# Patient Record
Sex: Male | Born: 1990 | Race: Asian | Hispanic: No | Marital: Single | Smoking: Never smoker
Health system: Southern US, Community
[De-identification: ages and names within clinical notes are randomized; demographics above are authoritative.]

---

## 2018-08-10 DIAGNOSIS — R1013 Epigastric pain: Secondary | ICD-10-CM | POA: Diagnosis not present

## 2018-08-13 DIAGNOSIS — K625 Hemorrhage of anus and rectum: Secondary | ICD-10-CM | POA: Diagnosis not present

## 2018-08-14 ENCOUNTER — Emergency Department (HOSPITAL_COMMUNITY)
Admission: EM | Admit: 2018-08-14 | Discharge: 2018-08-14 | Disposition: A | Discharge status: Home / Self Care | Payer: 59 | Attending: Emergency Medicine | Admitting: Emergency Medicine

## 2018-08-14 ENCOUNTER — Emergency Department (HOSPITAL_COMMUNITY): Payer: 59

## 2018-08-14 ENCOUNTER — Encounter (HOSPITAL_COMMUNITY): Payer: Self-pay

## 2018-08-14 DIAGNOSIS — R0789 Other chest pain: Secondary | ICD-10-CM | POA: Diagnosis not present

## 2018-08-14 DIAGNOSIS — R142 Eructation: Secondary | ICD-10-CM

## 2018-08-14 DIAGNOSIS — R079 Chest pain, unspecified: Secondary | ICD-10-CM | POA: Diagnosis not present

## 2018-08-14 LAB — BASIC METABOLIC PANEL
Anion gap: 7 (ref 5–15)
BUN: 7 mg/dL (ref 6–20)
CALCIUM: 9.2 mg/dL (ref 8.9–10.3)
CO2: 25 mmol/L (ref 22–32)
CREATININE: 1.14 mg/dL (ref 0.61–1.24)
Chloride: 104 mmol/L (ref 98–111)
GFR calc Af Amer: >60 mL/min (ref >60)
GLUCOSE: 106 mg/dL — AB (ref 70–99)
Potassium: 3.7 mmol/L (ref 3.5–5.1)
Sodium: 136 mmol/L (ref 135–145)

## 2018-08-14 LAB — CBC
HCT: 47.3 % (ref 39.0–52.0)
Hemoglobin: 15.2 g/dL (ref 13.0–17.0)
MCH: 28.1 pg (ref 26.0–34.0)
MCHC: 32.1 g/dL (ref 30.0–36.0)
MCV: 87.6 fL (ref 80.0–100.0)
PLATELETS: 244 10*3/uL (ref 150–400)
RBC: 5.4 MIL/uL (ref 4.22–5.81)
RDW: 13 % (ref 11.5–15.5)
WBC: 5.3 10*3/uL (ref 4.0–10.5)
nRBC: 0 % (ref 0.0–0.2)

## 2018-08-14 LAB — I-STAT TROPONIN, ED: TROPONIN I, POC: 0 ng/mL (ref 0.00–0.08)

## 2018-08-14 MED ORDER — LIDOCAINE VISCOUS HCL 2 % MT SOLN
15.0000 mL | Freq: Once | OROMUCOSAL | Status: AC
  Administered 2018-08-14: 15 mL via ORAL
  Filled 2018-08-14: qty 15

## 2018-08-14 MED ORDER — OMEPRAZOLE 20 MG PO CPDR: 20.0000 mg | DELAYED_RELEASE_CAPSULE | Freq: Every day | ORAL | 0 refills | Status: DC

## 2018-08-14 MED ORDER — ALUM & MAG HYDROXIDE-SIMETH 200-200-20 MG/5ML PO SUSP
30.0000 mL | Freq: Once | ORAL | Status: AC
  Administered 2018-08-14: 30 mL via ORAL
  Filled 2018-08-14: qty 30

## 2018-08-14 NOTE — ED Triage Notes (Signed)
Pt presents for evaluation of L sided chest pain starting today. Pt reports he has had some GI distress with belching/gas since Thursday, worse after eating. Pt reports radiation of chest pain to L arm and back.

## 2018-08-14 NOTE — ED Provider Notes (Signed)
[facility] EMERGENCY DEPARTMENT Provider Note   CSN: [id number] Arrival date & time: 08/14/18  1453     History   Chief Complaint Chief Complaint  Patient presents with  . Chest Pain    HPI Christopher Little is a 27 y.o. male with no significant past medical history presents emergency department today for chest pressure.  Patient reports that on Thursday he ate spicy food and shortly after began having substernal burning, "gas" as well as burping and belching.  He notes that since then time he ate spicy food been experiencing the same symptoms.  He has been trying home remedies including hot water with ginger, honey and a natural herb for his symptoms without any relief.  He notes that today he also felt a pulling sensation in his left wrist for a few seconds it was not associated with the burning in his chest.  He was reading things online and did not want to arrest so he came in.  His symptoms are not exertional, pleuritic or positional nature.  He denies any abdominal pain.  There is no associated nausea, vomiting, diaphoresis.  He denies fever cough.  Patient does report family history of CAD including patient's father.  Patient denies any tobacco use or illicit drug use. Denies risk factors for PE including exogenous testosterone use, recent surgery or travel, trauma, immobilization, previous blood clot, hemoptysis, cancer, lower extremity pain or swelling.   HPI  History reviewed. No pertinent past medical history.  There are no active problems to display for this patient.   History reviewed. No pertinent surgical history.      Home Medications    Prior to Admission medications   Not on File    Family History No family history on file.  Social History Social History   Tobacco Use  . Smoking status: Not on file  Substance Use Topics  . Alcohol use: Not on file  . Drug use: Not on file     Allergies   Patient has no known allergies.   Review of  Systems Review of Systems  All other systems reviewed and are negative.    Physical Exam Updated Vital Signs BP 116/80   Pulse 68   Temp 97.9 F (36.6 C) (Oral)   Resp 16   SpO2 100%   Physical Exam  Constitutional: He appears well-developed and well-nourished.  HENT:  Head: Normocephalic and atraumatic.  Right Ear: External ear normal.  Left Ear: External ear normal.  Nose: Nose normal.  Mouth/Throat: Uvula is midline, oropharynx is clear and moist and mucous membranes are normal. No tonsillar exudate.  Eyes: Pupils are equal, round, and reactive to light. Right eye exhibits no discharge. Left eye exhibits no discharge. No scleral icterus.  Neck: Trachea normal. Neck supple. No spinous process tenderness present. No neck rigidity. Normal range of motion present.  Cardiovascular: Normal rate, regular rhythm and intact distal pulses.  No murmur heard. Pulses:      Radial pulses are 2+ on the right side, and 2+ on the left side.       Dorsalis pedis pulses are 2+ on the right side, and 2+ on the left side.       Posterior tibial pulses are 2+ on the right side, and 2+ on the left side.  No lower extremity swelling or edema. Calves symmetric in size bilaterally.  Pulmonary/Chest: Effort normal and breath sounds normal. He exhibits no tenderness.  Abdominal: Soft. Bowel sounds are normal. He exhibits  no distension. There is no tenderness. There is no rigidity, no rebound, no guarding, no CVA tenderness and negative Murphy's sign.  Musculoskeletal: He exhibits no edema.  Lymphadenopathy:    He has no cervical adenopathy.  Neurological: He is alert.  Skin: Skin is warm and dry. No rash noted. He is not diaphoretic.  Psychiatric: He has a normal mood and affect.  Nursing note and vitals reviewed.    ED Treatments / Results  Labs (all labs ordered are listed, but only abnormal results are displayed) Labs Reviewed  BASIC METABOLIC PANEL - Abnormal; Notable for the following  components:      Result Value   Glucose, Bld 106 (*)    All other components within normal limits  CBC  I-STAT TROPONIN, ED    EKG None  Radiology Dg Chest 2 View  Result Date: 08/14/2018 CLINICAL DATA:  27 y/o  M; left-sided chest pain. EXAM: CHEST - 2 VIEW COMPARISON:  None. FINDINGS: The heart size and mediastinal contours are within normal limits. Both lungs are clear. The visualized skeletal structures are unremarkable. IMPRESSION: No acute pulmonary process identified. Electronically Signed   By: Mitzi Hansen M.D.   On: 08/14/2018 15:38    Procedures Procedures (including critical care time)  Medications Ordered in ED Medications  alum & mag hydroxide-simeth (MAALOX/MYLANTA) 200-200-20 MG/5ML suspension 30 mL (30 mLs Oral Given 08/14/18 1836)    And  lidocaine (XYLOCAINE) 2 % viscous mouth solution 15 mL (15 mLs Oral Given 08/14/18 1836)     Initial Impression / Assessment and Plan / ED Course  I have reviewed the triage vital signs and the nursing notes.  Pertinent labs & imaging results that were available during my care of the patient were reviewed by me and considered in my medical decision making (see chart for details).     Patient presented with burning in the chest with burping and belching after eating spicy foods to the ED. Patient is to be discharged with recommendation to follow up with PCP in regards to today's hospital visit. Chest pain is not likely of cardiac or pulmonary etiology due to presentation, perc negative, stable vital signs, no tracheal deviation, no JVD or new murmur, RRR, breath sounds equal bilaterally, EKG without acute abnormalities, negative troponin, and negative CXR. HEART score is low risk. Patient has been advised to return to the ED if chest pain becomes exertional, associated with diaphoresis or nausea, radiates to left jaw/arm, worsens or becomes concerning in any way. Patient appears reliable for follow up and is agreeable  to discharge. Pt had improvement after gi cocktail. Will give PPI. Do not suspect cholecystitis as patient is without any right upper quadrant tenderness palpation is a negative Murphy sign.  I advised the patient to follow-up with their primary care provider this week. I advised the patient to return to the emergency department with new or worsening symptoms or new concerns. The patient verbalized understanding and agreement with plan. Patient stable for discharge.   Final Clinical Impressions(s) / ED Diagnoses   Final diagnoses:  Burning in the chest  Belching    ED Discharge Orders         Ordered    omeprazole (PRILOSEC) 20 MG capsule  Daily     08/14/18 1917           Princella Pellegrini 08/14/18 1917    Melene Plan, DO 08/15/18 1107

## 2018-08-14 NOTE — Discharge Instructions (Signed)
Read instructions below for reasons to return to the Emergency Department. It is recommended that your follow up with your Primary Care Doctor in regards to today's visit. If you do not have a doctor, use the resource guide listed below to help you find one.  Begin taking over the counter Prilosec or Zegrid (omeprazole) as directed.   Tests performed today include: An EKG of your heart A chest x-ray Cardiac enzymes - a blood test for heart muscle damage Blood counts and electrolytes Vital signs. See below for your results today.  Chest Pain (Nonspecific)  HOME CARE INSTRUCTIONS  For the next few days, avoid physical activities that bring on chest pain. Continue physical activities as directed.  Do not smoke cigarettes or drink alcohol until your symptoms are gone. If you do smoke, it is time to quit. You may receive instructions and counseling on how to stop smoking. Only take over-the-counter or prescription medicine for pain, discomfort, or fever as directed by your caregiver.  Follow your caregiver's suggestions for further testing if your chest pain does not go away.  Keep any follow-up appointments you made. If you do not go to an appointment, you could develop lasting (chronic) problems with pain. If there is any problem keeping an appointment, you must call to reschedule.  SEEK MEDICAL CARE IF:  You think you are having problems from the medicine you are taking. Read your medicine instructions carefully.  Your chest pain does not go away, even after treatment.  You develop a rash with blisters on your chest.  SEEK IMMEDIATE MEDICAL CARE IF:  You have increased chest pain or pain that spreads to your arm, neck, jaw, back, or belly (abdomen).  You develop shortness of breath, an increasing cough, or you are coughing up blood.  You have severe back or abdominal pain, feel sick to your stomach (nauseous) or throw up (vomit).  You develop severe weakness, fainting, or chills.  You have an  oral temperature above 102 F (38.9 C), not controlled by medicine.  You have pain in your abdomen.  You vomit THIS IS AN EMERGENCY. Do not wait to see if the pain will go away. Get medical help at once. Call your local emergency services (911 in [place] Do not drive yourself to the hospital. Additional Information:  Your vital signs today were: BP 116/80    Pulse 68    Temp 97.9 F (36.6 C) (Oral)    Resp 16    SpO2 100%  If your blood pressure (BP) was elevated above 135/85 this visit, please have this repeated by your doctor within one month. ---------------

## 2018-08-28 ENCOUNTER — Other Ambulatory Visit: Payer: Self-pay

## 2018-08-28 ENCOUNTER — Encounter: Payer: Self-pay | Admitting: Emergency Medicine

## 2018-08-28 ENCOUNTER — Ambulatory Visit: Payer: 59 | Admitting: Emergency Medicine

## 2018-08-28 ENCOUNTER — Encounter

## 2018-08-28 VITALS — BP 126/78 | HR 81 | Temp 98.2°F | Resp 18 | Ht 71.65 in | Wt 229.4 lb

## 2018-08-28 DIAGNOSIS — R12 Heartburn: Secondary | ICD-10-CM | POA: Diagnosis not present

## 2018-08-28 DIAGNOSIS — K219 Gastro-esophageal reflux disease without esophagitis: Secondary | ICD-10-CM

## 2018-08-28 MED ORDER — RANITIDINE HCL 300 MG PO TABS: 300.0000 mg | ORAL_TABLET | Freq: Every day | ORAL | 1 refills | Status: DC

## 2018-08-28 NOTE — Patient Instructions (Addendum)
If you have lab work done today you will be contacted with your lab results within the next 2 weeks.  If you have not heard from us then please contact us. The fastest way to get your results is to register for My Chart.   IF you received an x-ray today, you will receive an invoice from [facility]. Please contact [facility] at [phone] with questions or concerns regarding your invoice.   IF you received labwork today, you will receive an invoice from [facility]. Please contact [facility] at [phone] with questions or concerns regarding your invoice.   Our billing staff will not be able to assist you with questions regarding bills from these companies.  You will be contacted with the lab results as soon as they are available. The fastest way to get your results is to activate your My Chart account. Instructions are located on the last page of this paperwork. If you have not heard from us regarding the results in 2 weeks, please contact this office.     Gastritis, Adult Gastritis is swelling (inflammation) of the stomach. When you have this condition, you can have these problems (symptoms):  Pain in your stomach.  A burning feeling in your stomach.  Feeling sick to your stomach (nauseous).  Throwing up (vomiting).  Feeling too full after you eat.  It is important to get help for this condition. Without help, your stomach can bleed, and you can get sores (ulcers) in your stomach. Follow these instructions at home:  Take over-the-counter and prescription medicines only as told by your doctor.  If you were prescribed an antibiotic medicine, take it as told by your doctor. Do not stop taking it even if you start to feel better.  Drink enough fluid to keep your pee (urine) clear or pale yellow.  Instead of eating big meals, eat small meals often. Contact a health care provider if:  Your problems get worse.  Your problems go away and then come  back. Get help right away if:  You throw up blood or something that looks like coffee grounds.  You have black or dark red poop (stools).  You cannot keep fluids down.  Your stomach pain gets worse.  You have a fever.  You do not feel better after 1 week. This information is not intended to replace advice given to you by your health care provider. Make sure you discuss any questions you have with your health care provider. Document Released: 02/22/2008 Document Revised: 05/04/2016 Document Reviewed: 05/30/2015 Elsevier Interactive Patient Education  2018 [employer]  Food Choices for Gastroesophageal Reflux Disease, Adult When you have gastroesophageal reflux disease (GERD), the foods you eat and your eating habits are very important. Choosing the right foods can help ease your discomfort. What guidelines do I need to follow?  Choose fruits, vegetables, whole grains, and low-fat dairy products.  Choose low-fat meat, fish, and poultry.  Limit fats such as oils, salad dressings, butter, nuts, and avocado.  Keep a food diary. This helps you identify foods that cause symptoms.  Avoid foods that cause symptoms. These may be different for everyone.  Eat small meals often instead of 3 large meals a day.  Eat your meals slowly, in a place where you are relaxed.  Limit fried foods.  Cook foods using methods other than frying.  Avoid drinking alcohol.  Avoid drinking large amounts of liquids with your meals.  Avoid bending over or lying down until 2-3 hours after  eating. What foods are not recommended? These are some foods and drinks that may make your symptoms worse: Vegetables Tomatoes. Tomato juice. Tomato and spaghetti sauce. Chili peppers. Onion and garlic. Horseradish. Fruits Oranges, grapefruit, and lemon (fruit and juice). Meats High-fat meats, fish, and poultry. This includes hot dogs, ribs, ham, sausage, salami, and bacon. Dairy Whole milk and chocolate milk.  Sour cream. Cream. Butter. Ice cream. Cream cheese. Drinks Coffee and tea. Bubbly (carbonated) drinks or energy drinks. Condiments Hot sauce. Barbecue sauce. Sweets/Desserts Chocolate and cocoa. Donuts. Peppermint and spearmint. Fats and Oils High-fat foods. This includes [place] fries and potato chips. Other Vinegar. Strong spices. This includes black pepper, white pepper, red pepper, cayenne, curry powder, cloves, ginger, and chili powder. The items listed above may not be a complete list of foods and drinks to avoid. Contact your dietitian for more information. This information is not intended to replace advice given to you by your health care provider. Make sure you discuss any questions you have with your health care provider. Document Released: 03/06/2012 Document Revised: 02/11/2016 Document Reviewed: 07/10/2013 Elsevier Interactive Patient Education  2017 [employer].

## 2018-08-28 NOTE — Progress Notes (Signed)
Christopher Little 27 y.o.   Chief Complaint  Patient presents with  . Gastroesophageal Reflux    15 years went to ER for this a couple weeks ago     HISTORY OF PRESENT ILLNESS: This is a 27 y.o. male here today for evaluation of heartburn.  Went to the emergency room on 08/14/2018 complaining of burning chest.  Work-up was negative.  Normal EKG, normal chest x-ray, and normal blood work including negative troponin.  Describes pain as intermittent, burning, with no radiation, increased belching, slight nausea, no vomiting, difficulty breathing, wheezing, melena or rectal bleeding.  Was started on omeprazole 20 mg daily.  Asymptomatic now.  Non-smoker and non-EtOH user.  Works as an Art gallery managerengineer.  Originally from [place].  No history of any chronic medical problems.  HPI   Prior to Admission medications   Medication Sig Start Date End Date Taking? Authorizing Provider  omeprazole (PRILOSEC) 20 MG capsule Take 1 capsule (20 mg total) by mouth daily. 08/14/18  Yes Maczis, Elmer SowMichael M, PA-C    No Known Allergies  There are no active problems to display for this patient.   No past medical history on file.  No past surgical history on file.  Social History   Socioeconomic History  . Marital status: Single    Spouse name: Not on file  . Number of children: Not on file  . Years of education: Not on file  . Highest education level: Not on file  Occupational History  . Not on file  Social Needs  . Financial resource strain: Not on file  . Food insecurity:    Worry: Not on file    Inability: Not on file  . Transportation needs:    Medical: Not on file    Non-medical: Not on file  Tobacco Use  . Smoking status: Never Smoker  . Smokeless tobacco: Never Used  Substance and Sexual Activity  . Alcohol use: Not on file  . Drug use: Not on file  . Sexual activity: Not on file  Lifestyle  . Physical activity:    Days per week: Not on file    Minutes per session: Not on file  . Stress: Not on  file  Relationships  . Social connections:    Talks on phone: Not on file    Gets together: Not on file    Attends religious service: Not on file    Active member of club or organization: Not on file    Attends meetings of clubs or organizations: Not on file    Relationship status: Not on file  . Intimate partner violence:    Fear of current or ex partner: Not on file    Emotionally abused: Not on file    Physically abused: Not on file    Forced sexual activity: Not on file  Other Topics Concern  . Not on file  Social History Narrative  . Not on file    No family history on file.   Review of Systems  Constitutional: Negative.  Negative for chills and fever.  HENT: Negative.  Negative for sore throat.   Eyes: Negative.  Negative for blurred vision and double vision.  Respiratory: Negative.  Negative for cough and shortness of breath.   Cardiovascular: Negative.  Negative for chest pain and palpitations.  Gastrointestinal: Positive for heartburn and nausea. Negative for abdominal pain, blood in stool, constipation, diarrhea, melena and vomiting.  Genitourinary: Negative.  Negative for dysuria and hematuria.  Musculoskeletal: Negative.  Negative for  back pain, myalgias and neck pain.  Skin: Negative.  Negative for rash.  Neurological: Negative.  Negative for dizziness and headaches.  Endo/Heme/Allergies: Negative.   All other systems reviewed and are negative.  Vitals:   08/28/18 1423  BP: 126/78  Pulse: 81  Resp: 18  Temp: 98.2 F (36.8 C)  SpO2: 99%     Physical Exam  Constitutional: He is oriented to person, place, and time. He appears well-developed and well-nourished.  HENT:  Head: Normocephalic and atraumatic.  Nose: Nose normal.  Mouth/Throat: Oropharynx is clear and moist.  Eyes: Pupils are equal, round, and reactive to light. Conjunctivae and EOM are normal.  Neck: Normal range of motion. Neck supple. No thyromegaly present.  Cardiovascular: Normal rate,  regular rhythm and normal heart sounds.  Pulmonary/Chest: Effort normal and breath sounds normal. No respiratory distress.  Abdominal: Soft. Bowel sounds are normal. He exhibits no distension and no mass. There is no tenderness. There is no guarding.  Musculoskeletal: Normal range of motion.  Lymphadenopathy:    He has no cervical adenopathy.  Neurological: He is alert and oriented to person, place, and time. No sensory deficit. He exhibits normal muscle tone.  Skin: Skin is warm and dry. Capillary refill takes less than 2 seconds.  Psychiatric: He has a normal mood and affect. His behavior is normal.  Vitals reviewed.  A total of 30 minutes was spent in the room with the patient, greater than 50% of which was in counseling/coordination of care regarding differential diagnosis, treatment, nutrition, medications, review of labs, EKG, and chest x-ray, prognosis, and need for follow-up if no better or worse.   ASSESSMENT & PLAN: Christopher Little was seen today for gastroesophageal reflux.  Diagnoses and all orders for this visit:  Gastroesophageal reflux disease without esophagitis -     ranitidine (ZANTAC) 300 MG tablet; Take 1 tablet (300 mg total) by mouth at bedtime for 14 days. -     CBC with Differential/Platelet -     Comprehensive metabolic panel -     H. pylori breath test -     Hemoglobin A1c -     Lipid panel  Heartburn -     H. pylori breath test    Patient Instructions       If you have lab work done today you will be contacted with your lab results within the next 2 weeks.  If you have not heard from [place] then please contact us. The fastest way to get your results is to register for My Chart.   IF you received an x-ray today, you will receive an invoice from [facility]. Please contact [facility] at [phone] with questions or concerns regarding your invoice.   IF you received labwork today, you will receive an invoice from [facility]. Please contact [facility]  at [phone] with questions or concerns regarding your invoice.   Our billing staff will not be able to assist you with questions regarding bills from these companies.  You will be contacted with the lab results as soon as they are available. The fastest way to get your results is to activate your My Chart account. Instructions are located on the last page of this paperwork. If you have not heard from [place] regarding the results in 2 weeks, please contact this office.     Gastritis, Adult Gastritis is swelling (inflammation) of the stomach. When you have this condition, you can have these problems (symptoms):  Pain in your stomach.  A burning feeling in  your stomach.  Feeling sick to your stomach (nauseous).  Throwing up (vomiting).  Feeling too full after you eat.  It is important to get help for this condition. Without help, your stomach can bleed, and you can get sores (ulcers) in your stomach. Follow these instructions at home:  Take over-the-counter and prescription medicines only as told by your doctor.  If you were prescribed an antibiotic medicine, take it as told by your doctor. Do not stop taking it even if you start to feel better.  Drink enough fluid to keep your pee (urine) clear or pale yellow.  Instead of eating big meals, eat small meals often. Contact a health care provider if:  Your problems get worse.  Your problems go away and then come back. Get help right away if:  You throw up blood or something that looks like coffee grounds.  You have black or dark red poop (stools).  You cannot keep fluids down.  Your stomach pain gets worse.  You have a fever.  You do not feel better after 1 week. This information is not intended to replace advice given to you by your health care provider. Make sure you discuss any questions you have with your health care provider. Document Released: 02/22/2008 Document Revised: 05/04/2016 Document Reviewed:  05/30/2015 Elsevier Interactive Patient Education  2018 [employer].  Food Choices for Gastroesophageal Reflux Disease, Adult When you have gastroesophageal reflux disease (GERD), the foods you eat and your eating habits are very important. Choosing the right foods can help ease your discomfort. What guidelines do I need to follow?  Choose fruits, vegetables, whole grains, and low-fat dairy products.  Choose low-fat meat, fish, and poultry.  Limit fats such as oils, salad dressings, butter, nuts, and avocado.  Keep a food diary. This helps you identify foods that cause symptoms.  Avoid foods that cause symptoms. These may be different for everyone.  Eat small meals often instead of 3 large meals a day.  Eat your meals slowly, in a place where you are relaxed.  Limit fried foods.  Cook foods using methods other than frying.  Avoid drinking alcohol.  Avoid drinking large amounts of liquids with your meals.  Avoid bending over or lying down until 2-3 hours after eating. What foods are not recommended? These are some foods and drinks that may make your symptoms worse: Vegetables Tomatoes. Tomato juice. Tomato and spaghetti sauce. Chili peppers. Onion and garlic. Horseradish. Fruits Oranges, grapefruit, and lemon (fruit and juice). Meats High-fat meats, fish, and poultry. This includes hot dogs, ribs, ham, sausage, salami, and bacon. Dairy Whole milk and chocolate milk. Sour cream. Cream. Butter. Ice cream. Cream cheese. Drinks Coffee and tea. Bubbly (carbonated) drinks or energy drinks. Condiments Hot sauce. Barbecue sauce. Sweets/Desserts Chocolate and cocoa. Donuts. Peppermint and spearmint. Fats and Oils High-fat foods. This includes [place] fries and potato chips. Other Vinegar. Strong spices. This includes black pepper, white pepper, red pepper, cayenne, curry powder, cloves, ginger, and chili powder. The items listed above may not be a complete list of foods and  drinks to avoid. Contact your dietitian for more information. This information is not intended to replace advice given to you by your health care provider. Make sure you discuss any questions you have with your health care provider. Document Released: 03/06/2012 Document Revised: 02/11/2016 Document Reviewed: 07/10/2013 Elsevier Interactive Patient Education  2017 Elsevier Inc.      Edwina Barth, MD [facility]

## 2018-08-29 LAB — COMPREHENSIVE METABOLIC PANEL
ALK PHOS: 72 IU/L (ref 39–117)
ALT: 17 IU/L (ref 0–44)
AST: 18 IU/L (ref 0–40)
Albumin/Globulin Ratio: 1.8 (ref 1.2–2.2)
Albumin: 4.6 g/dL (ref 3.5–5.5)
BUN / CREAT RATIO: 8 — AB (ref 9–20)
BUN: 9 mg/dL (ref 6–20)
Bilirubin Total: 0.4 mg/dL (ref 0.0–1.2)
CALCIUM: 9.6 mg/dL (ref 8.7–10.2)
CHLORIDE: 101 mmol/L (ref 96–106)
CO2: 23 mmol/L (ref 20–29)
Creatinine, Ser: 1.11 mg/dL (ref 0.76–1.27)
GFR calc Af Amer: 105 mL/min/{1.73_m2} (ref >59)
GFR, EST NON AFRICAN AMERICAN: 91 mL/min/{1.73_m2} (ref >59)
GLOBULIN, TOTAL: 2.5 g/dL (ref 1.5–4.5)
GLUCOSE: 85 mg/dL (ref 65–99)
Potassium: 4.1 mmol/L (ref 3.5–5.2)
Sodium: 138 mmol/L (ref 134–144)
TOTAL PROTEIN: 7.1 g/dL (ref 6.0–8.5)

## 2018-08-29 LAB — CBC WITH DIFFERENTIAL/PLATELET
BASOS ABS: 0 10*3/uL (ref 0.0–0.2)
BASOS: 1 %
EOS (ABSOLUTE): 0 10*3/uL (ref 0.0–0.4)
Eos: 0 %
Hematocrit: 42.5 % (ref 37.5–51.0)
Hemoglobin: 14.4 g/dL (ref 13.0–17.7)
Immature Grans (Abs): 0 10*3/uL (ref 0.0–0.1)
Immature Granulocytes: 0 %
LYMPHS: 35 %
Lymphocytes Absolute: 2.2 10*3/uL (ref 0.7–3.1)
MCH: 28.3 pg (ref 26.6–33.0)
MCHC: 33.9 g/dL (ref 31.5–35.7)
MCV: 84 fL (ref 79–97)
Monocytes Absolute: 0.6 10*3/uL (ref 0.1–0.9)
Monocytes: 10 %
Neutrophils Absolute: 3.4 10*3/uL (ref 1.4–7.0)
Neutrophils: 54 %
Platelets: 273 10*3/uL (ref 150–450)
RBC: 5.08 x10E6/uL (ref 4.14–5.80)
RDW: 13.6 % (ref 12.3–15.4)
WBC: 6.3 10*3/uL (ref 3.4–10.8)

## 2018-08-29 LAB — HEMOGLOBIN A1C
Est. average glucose Bld gHb Est-mCnc: 108 mg/dL
Hgb A1c MFr Bld: 5.4 % (ref 4.8–5.6)

## 2018-08-29 LAB — LIPID PANEL
Chol/HDL Ratio: 5.1 ratio — ABNORMAL HIGH (ref 0.0–5.0)
Cholesterol, Total: 147 mg/dL (ref 100–199)
HDL: 29 mg/dL — ABNORMAL LOW (ref >39)
LDL Calculated: 82 mg/dL (ref 0–99)
TRIGLYCERIDES: 181 mg/dL — AB (ref 0–149)
VLDL CHOLESTEROL CAL: 36 mg/dL (ref 5–40)

## 2018-08-29 LAB — H. PYLORI BREATH TEST: H PYLORI BREATH TEST: POSITIVE — AB

## 2018-08-29 LAB — H. PYLORI BREATH COLLECTION

## 2018-08-31 ENCOUNTER — Other Ambulatory Visit: Payer: Self-pay | Admitting: Emergency Medicine

## 2018-08-31 ENCOUNTER — Encounter: Payer: Self-pay | Admitting: Emergency Medicine

## 2018-08-31 DIAGNOSIS — A048 Other specified bacterial intestinal infections: Secondary | ICD-10-CM

## 2018-08-31 MED ORDER — AMOXICILL-CLARITHRO-LANSOPRAZ PO MISC: Freq: Two times a day (BID) | ORAL | 1 refills | Status: DC

## 2018-09-04 ENCOUNTER — Other Ambulatory Visit: Payer: Self-pay | Admitting: Emergency Medicine

## 2018-09-04 ENCOUNTER — Ambulatory Visit: Payer: Self-pay

## 2018-09-04 ENCOUNTER — Encounter: Payer: Self-pay | Admitting: Emergency Medicine

## 2018-09-04 DIAGNOSIS — A048 Other specified bacterial intestinal infections: Secondary | ICD-10-CM

## 2018-09-04 DIAGNOSIS — K279 Peptic ulcer, site unspecified, unspecified as acute or chronic, without hemorrhage or perforation: Secondary | ICD-10-CM

## 2018-09-04 MED ORDER — LANSOPRAZOLE 30 MG PO TBDD: 30.0000 mg | DELAYED_RELEASE_TABLET | Freq: Two times a day (BID) | ORAL | 0 refills | Status: DC

## 2018-09-04 MED ORDER — AMOXICILLIN 500 MG PO CAPS: 1000.0000 mg | ORAL_CAPSULE | Freq: Two times a day (BID) | ORAL | 0 refills | Status: AC

## 2018-09-04 MED ORDER — CLARITHROMYCIN 500 MG PO TABS: 500.0000 mg | ORAL_TABLET | Freq: Two times a day (BID) | ORAL | 0 refills | Status: AC

## 2018-09-04 NOTE — Telephone Encounter (Signed)
Copied from CRM [id number]. Topic: General - Other >> Sep 04, 2018  9:45 AM Percival SpanishKennedy, Cheryl W wrote:  Pt call to say the medicine is not available per the pharmacy  amoxicillin-clarithromycin-lansoprazole [facility](PREVPAC) combo pack and is  not available and is requesting something else   Pt said is having some pain and is needing something today  please call pt and let him know what is going on      Pharmacy    [facility]

## 2018-09-04 NOTE — Telephone Encounter (Signed)
Pt called stating that he has burning pain to his abdomin. He has been seen last week by Dr Alvy BimlerSagardia and ordered an antibiotic that is not available at his pharmacy or any others that he has tried. He state it is over $400. Pt states that he has noticed his stools are darkening. He states his stomach is tender to touch. Pt states he go from constipation to normal BM but continues to have the pain. Pt is requesting that Dr Alvy BimlerSagardia order the nmedications individually. His pharmacy just needs the dose of each. Per protocol pt will go to urgent care for re evaluation of his symptoms within the next 24 hours. Care advice was to go to urgent care. Message will be routed to PCP for medication request.  Reason for Disposition . [1] MODERATE pain (e.g., interferes with normal activities) AND [2] pain comes and goes (cramps) AND [3] present > 24 hours  (Exception: pain with Vomiting or Diarrhea - see that Guideline)  Answer Assessment - Initial Assessment Questions 1. LOCATION: "Where does it hurt?"      All over abdomin  2. RADIATION: "Does the pain shoot anywhere else?" (e.g., chest, back)    continous pain and goes to back 3. ONSET: "When did the pain begin?" (Minutes, hours or days ago)      1 week ago 4. SUDDEN: "Gradual or sudden onset?"     gradual 5. PATTERN "Does the pain come and go, or is it constant?"    - If constant: "Is it getting better, staying the same, or worsening?"      (Note: Constant means the pain never goes away completely; most serious pain is constant and it progresses)     - If intermittent: "How long does it last?" "Do you have pain now?"     (Note: Intermittent means the pain goes away completely between bouts)     constant 6. SEVERITY: "How bad is the pain?"  (e.g., Scale 1-10; mild, moderate, or severe)    - MILD (1-3): doesn't interfere with normal activities, abdomen soft and not tender to touch     - MODERATE (4-7): interferes with normal activities or awakens from  sleep, tender to touch     - SEVERE (8-10): excruciating pain, doubled over, unable to do any normal activities       Tender interfers with normal activity 7. RECURRENT SYMPTOM: "Have you ever had this type of abdominal pain before?" If so, ask: "When was the last time?" and "What happened that time?"      no 8. CAUSE: "What do you think is causing the abdominal pain?"     unsure 9. RELIEVING/AGGRAVATING FACTORS: "What makes it better or worse?" (e.g., movement, antacids, bowel movement)     Dark constipation to normal yogert makes it better 10. OTHER SYMPTOMS: "Has there been any vomiting, diarrhea, constipation, or urine problems?"       Constipation to normal  Protocols used: ABDOMINAL PAIN - MALE-A-AH

## 2018-09-06 ENCOUNTER — Other Ambulatory Visit: Payer: Self-pay | Admitting: Emergency Medicine

## 2018-09-06 MED ORDER — LANSOPRAZOLE 30 MG PO CPDR: 30.0000 mg | DELAYED_RELEASE_CAPSULE | Freq: Every day | ORAL | 0 refills | Status: DC

## 2018-09-06 NOTE — Telephone Encounter (Signed)
Request for PA was sent in and came back DENIED for LANSOPRAZOLE TAB 30 MG Delayed release orally disintegrating tablet.   One of the following must be met which were not on the original PA:  1. You are unable to take a solid dosage form due to one of the following: (a) Oral/motor difficulties or (b) difficulty swallowing. 2. OR you use a feeding tube for medication administration  Please advise. Thank you!  Forms are in Dr. Barry Brunner box at the nurse's station

## 2018-09-06 NOTE — Telephone Encounter (Signed)
Prescription for regular Prevacid sent.  No need for PA.  Thanks.

## 2018-09-07 NOTE — Telephone Encounter (Signed)
FYI

## 2018-09-07 NOTE — Telephone Encounter (Signed)
This was already taking care of 2 days ago.

## 2018-09-13 ENCOUNTER — Encounter: Payer: Self-pay | Admitting: Emergency Medicine

## 2018-09-14 NOTE — Telephone Encounter (Signed)
Needs OV.  

## 2018-09-26 ENCOUNTER — Encounter

## 2018-09-26 ENCOUNTER — Ambulatory Visit: Payer: 59 | Admitting: Emergency Medicine

## 2018-09-26 ENCOUNTER — Encounter: Payer: Self-pay | Admitting: Emergency Medicine

## 2018-09-26 VITALS — BP 113/78 | HR 67 | Temp 98.8°F | Resp 17 | Ht 71.0 in | Wt 223.0 lb

## 2018-09-26 DIAGNOSIS — A048 Other specified bacterial intestinal infections: Secondary | ICD-10-CM

## 2018-09-26 DIAGNOSIS — K279 Peptic ulcer, site unspecified, unspecified as acute or chronic, without hemorrhage or perforation: Secondary | ICD-10-CM

## 2018-09-26 DIAGNOSIS — K219 Gastro-esophageal reflux disease without esophagitis: Secondary | ICD-10-CM | POA: Diagnosis not present

## 2018-09-26 MED ORDER — OMEPRAZOLE 40 MG PO CPDR: 40.0000 mg | DELAYED_RELEASE_CAPSULE | Freq: Every day | ORAL | 3 refills | Status: AC

## 2018-09-26 NOTE — Progress Notes (Signed)
Christopher Little 28 y.o.   Chief Complaint  Patient presents with  . Abdominal Pain    follow up    HISTORY OF PRESENT ILLNESS: This is a 28 y.o. male seen by me on 08/28/2018 with epigastric pain.  Work-up showed positive H. pylori.  Treated with antibiotics and PPI.  Last dose on 09/16/2018.  Still has some residual epigastric intermittent discomfort.  Able to eat and drink.  Denies nausea or vomiting.  No other significant symptoms.  HPI   Prior to Admission medications   Medication Sig Start Date End Date Taking? Authorizing Provider  amoxicillin-clarithromycin-lansoprazole [facility]) combo pack Take by mouth 2 (two) times daily. Follow package directions. Patient not taking: Reported on 09/26/2018 08/31/18   Georgina Quint, MD  omeprazole (PRILOSEC) 20 MG capsule Take 1 capsule (20 mg total) by mouth daily. Patient not taking: Reported on 09/26/2018 08/14/18   Jacinto Halim, PA-C    No Known Allergies  Patient Active Problem List   Diagnosis Date Noted  . Gastroesophageal reflux disease without esophagitis 08/28/2018  . Heartburn 08/28/2018    No past medical history on file.  No past surgical history on file.  Social History   Socioeconomic History  . Marital status: Single    Spouse name: Not on file  . Number of children: Not on file  . Years of education: Not on file  . Highest education level: Not on file  Occupational History  . Not on file  Social Needs  . Financial resource strain: Not on file  . Food insecurity:    Worry: Not on file    Inability: Not on file  . Transportation needs:    Medical: Not on file    Non-medical: Not on file  Tobacco Use  . Smoking status: Never Smoker  . Smokeless tobacco: Never Used  Substance and Sexual Activity  . Alcohol use: Not on file  . Drug use: Not on file  . Sexual activity: Not on file  Lifestyle  . Physical activity:    Days per week: Not on file    Minutes per session: Not on file  . Stress: Not  on file  Relationships  . Social connections:    Talks on phone: Not on file    Gets together: Not on file    Attends religious service: Not on file    Active member of club or organization: Not on file    Attends meetings of clubs or organizations: Not on file    Relationship status: Not on file  . Intimate partner violence:    Fear of current or ex partner: Not on file    Emotionally abused: Not on file    Physically abused: Not on file    Forced sexual activity: Not on file  Other Topics Concern  . Not on file  Social History Narrative  . Not on file    No family history on file.   Review of Systems  Constitutional: Negative.  Negative for chills, fever and weight loss.  HENT: Negative.  Negative for congestion and sore throat.   Eyes: Negative.  Negative for blurred vision and double vision.  Respiratory: Negative.  Negative for cough and shortness of breath.   Cardiovascular: Negative.  Negative for chest pain and palpitations.  Gastrointestinal: Positive for abdominal pain. Negative for blood in stool, diarrhea, melena, nausea and vomiting.  Genitourinary: Negative.   Musculoskeletal: Negative for back pain, myalgias and neck pain.  Skin: Negative.  Negative  for rash.  Neurological: Negative.  Negative for dizziness and headaches.  All other systems reviewed and are negative.   Vitals:   09/26/18 1423  BP: 113/78  Pulse: 67  Resp: 17  Temp: 98.8 F (37.1 C)  SpO2: 98%    Physical Exam Vitals signs reviewed.  HENT:     Head: Normocephalic and atraumatic.     Mouth/Throat:     Mouth: Mucous membranes are moist.     Pharynx: Oropharynx is clear.  Eyes:     Extraocular Movements: Extraocular movements intact.     Conjunctiva/sclera: Conjunctivae normal.     Pupils: Pupils are equal, round, and reactive to light.  Neck:     Musculoskeletal: Normal range of motion and neck supple.  Cardiovascular:     Rate and Rhythm: Normal rate and regular rhythm.    Pulmonary:     Effort: Pulmonary effort is normal.     Breath sounds: Normal breath sounds.  Abdominal:     General: Abdomen is flat. Bowel sounds are normal. There is no distension.     Palpations: Abdomen is soft. There is no mass.     Tenderness: There is no abdominal tenderness. There is no guarding or rebound.  Musculoskeletal: Normal range of motion.  Skin:    General: Skin is warm and dry.     Capillary Refill: Capillary refill takes less than 2 seconds.  Neurological:     General: No focal deficit present.     Mental Status: He is alert and oriented to person, place, and time.  Psychiatric:        Mood and Affect: Mood normal.        Behavior: Behavior normal.    A total of 25 minutes was spent in the room with the patient, greater than 50% of which was in counseling/coordination of care regarding diagnosis, treatment, medications, diet, and need for GI follow-up and possible upper endoscopy.   ASSESSMENT & PLAN: Christopher Little was seen today for abdominal pain.  Diagnoses and all orders for this visit:  PUD (peptic ulcer disease) -     omeprazole (PRILOSEC) 40 MG capsule; Take 1 capsule (40 mg total) by mouth daily. -     Ambulatory referral to Gastroenterology  Positive H. pylori test -     Ambulatory referral to Gastroenterology  Gastroesophageal reflux disease without esophagitis    Patient Instructions       If you have lab work done today you will be contacted with your lab results within the next 2 weeks.  If you have not heard from us then please contact us. The fastest way to get your results is to register for My Chart.   IF you received an x-ray today, you will receive an invoice from [facility]. Please contact [facility] at [phone] with questions or concerns regarding your invoice.   IF you received labwork today, you will receive an invoice from [facility]. Please contact [facility] at [phone] with questions or concerns regarding  your invoice.   Our billing staff will not be able to assist you with questions regarding bills from these companies.  You will be contacted with the lab results as soon as they are available. The fastest way to get your results is to activate your My Chart account. Instructions are located on the last page of this paperwork. If you have not heard from us regarding the results in 2 weeks, please contact this office.      Peptic Ulcer Eating  Plan Peptic ulcers are sores that form on the lining of the stomach, esophagus, or the part of the small intestine that is attached to the stomach (duodenum). These sores are also called stomach ulcers. When ulcers develop, they can cause a burning feeling in the stomach as well as bloating, nausea, vomiting, and poor appetite. If you have a history of peptic ulcers, it is important to keep track of what foods and drinks cause symptoms. What are tips for following this plan?   Eat a healthy, well-balanced diet. This includes: ? Fresh fruits and vegetables. Eat a variety of colors of fruits and vegetables. ? Whole grains. Try to make sure at least half of the grains you eat each day are whole grains. ? Low-fat dairy. ? Lean meat, fish, poultry, eggs, beans, and nuts. ? Healthy fats, such as olive oil, grapeseed oil, or canola oil. Try to eat less than 8 teaspoons of fats and oils each day.  Avoid foods that cause irritation or pain. These may be different for different people. Keep a food diary to identify foods that cause symptoms.  Avoid processed foods that have added salt and sugar.  Avoid drinking alcohol.  Avoid drinks with caffeine, such as cola, black tea, energy drinks, and coffee. Recommended foods Grains  Whole grains. Vegetables  All fresh or frozen vegetables. Low-sodium canned vegetables. Fruits  All fresh, frozen, or dried fruit. Fruit canned in juice. Meats and other protein foods  Lean cuts of meat. Skinless poultry. Fresh  or canned fish. Eggs. Tofu. Nuts and nut butter. Dried beans. Low-sodium canned beans. Dairy  Low-fat or nonfat (skim) milk. Nonfat or low-fat yogurt. Nonfat or low-fat cheese. Beverages  Water. Soy or nut milks. Caffeine-free soft drinks. Herbal tea. Fats and oils  Olive oil. Canola oil. Grapeseed oil. Sunflower oil. Seasoning and other foods  Low-fat salad dressing. Ketchup. Low-fat mayonnaise. All spices except pepper. Low-sodium seasoning mixes. Foods to avoid Meats and other protein foods  Fatty meats. Fried meats. Any meat that causes symptoms. Dairy  Whole milk. Ice cream. Cream. Chocolate milk. Beverages  Alcohol. Coffee. Cola and energy drinks. Black or green tea. Cocoa. Fats and oils  Butter. Lard. Ghee. Seasoning and other foods  Pepper. Hot sauce. Any seasonings or condiments that cause symptoms. Summary  Peptic ulcers can cause burning in the stomach as well as bloating, nausea, vomiting, and poor appetite. You may be able to limit symptoms by avoiding foods that make you feel worse.  Work with your dietitian or health care provider to identify foods that cause symptoms. This may include caffeinated drinks, alcohol, or pepper. This information is not intended to replace advice given to you by your health care provider. Make sure you discuss any questions you have with your health care provider. Document Released: 11/28/2011 Document Revised: 10/17/2016 Document Reviewed: 10/17/2016 Elsevier Interactive Patient Education  2019 [employer] Inc.      Edwina Barth, MD [facility]

## 2018-09-26 NOTE — Patient Instructions (Addendum)
If you have lab work done today you will be contacted with your lab results within the next 2 weeks.  If you have not heard from us then please contact us. The fastest way to get your results is to register for My Chart.   IF you received an x-ray today, you will receive an invoice from [facility]. Please contact [facility] at [phone] with questions or concerns regarding your invoice.   IF you received labwork today, you will receive an invoice from [facility]. Please contact [facility] at [phone] with questions or concerns regarding your invoice.   Our billing staff will not be able to assist you with questions regarding bills from these companies.  You will be contacted with the lab results as soon as they are available. The fastest way to get your results is to activate your My Chart account. Instructions are located on the last page of this paperwork. If you have not heard from us regarding the results in 2 weeks, please contact this office.      Peptic Ulcer Eating Plan Peptic ulcers are sores that form on the lining of the stomach, esophagus, or the part of the small intestine that is attached to the stomach (duodenum). These sores are also called stomach ulcers. When ulcers develop, they can cause a burning feeling in the stomach as well as bloating, nausea, vomiting, and poor appetite. If you have a history of peptic ulcers, it is important to keep track of what foods and drinks cause symptoms. What are tips for following this plan?   Eat a healthy, well-balanced diet. This includes: ? Fresh fruits and vegetables. Eat a variety of colors of fruits and vegetables. ? Whole grains. Try to make sure at least half of the grains you eat each day are whole grains. ? Low-fat dairy. ? Lean meat, fish, poultry, eggs, beans, and nuts. ? Healthy fats, such as olive oil, grapeseed oil, or canola oil. Try to eat less than 8 teaspoons of fats and oils each  day.  Avoid foods that cause irritation or pain. These may be different for different people. Keep a food diary to identify foods that cause symptoms.  Avoid processed foods that have added salt and sugar.  Avoid drinking alcohol.  Avoid drinks with caffeine, such as cola, black tea, energy drinks, and coffee. Recommended foods Grains  Whole grains. Vegetables  All fresh or frozen vegetables. Low-sodium canned vegetables. Fruits  All fresh, frozen, or dried fruit. Fruit canned in juice. Meats and other protein foods  Lean cuts of meat. Skinless poultry. Fresh or canned fish. Eggs. Tofu. Nuts and nut butter. Dried beans. Low-sodium canned beans. Dairy  Low-fat or nonfat (skim) milk. Nonfat or low-fat yogurt. Nonfat or low-fat cheese. Beverages  Water. Soy or nut milks. Caffeine-free soft drinks. Herbal tea. Fats and oils  Olive oil. Canola oil. Grapeseed oil. Sunflower oil. Seasoning and other foods  Low-fat salad dressing. Ketchup. Low-fat mayonnaise. All spices except pepper. Low-sodium seasoning mixes. Foods to avoid Meats and other protein foods  Fatty meats. Fried meats. Any meat that causes symptoms. Dairy  Whole milk. Ice cream. Cream. Chocolate milk. Beverages  Alcohol. Coffee. Cola and energy drinks. Black or green tea. Cocoa. Fats and oils  Butter. Lard. Ghee. Seasoning and other foods  Pepper. Hot sauce. Any seasonings or condiments that cause symptoms. Summary  Peptic ulcers can cause burning in the stomach as well as bloating, nausea, vomiting, and poor appetite. You may be  able to limit symptoms by avoiding foods that make you feel worse.  Work with your dietitian or health care provider to identify foods that cause symptoms. This may include caffeinated drinks, alcohol, or pepper. This information is not intended to replace advice given to you by your health care provider. Make sure you discuss any questions you have with your health care  provider. Document Released: 11/28/2011 Document Revised: 10/17/2016 Document Reviewed: 10/17/2016 Elsevier Interactive Patient Education  2019 [employer].

## 2018-10-11 ENCOUNTER — Encounter: Payer: Self-pay | Admitting: Emergency Medicine

## 2018-10-12 ENCOUNTER — Ambulatory Visit: Payer: 59 | Admitting: Physician Assistant

## 2018-10-12 DIAGNOSIS — R079 Chest pain, unspecified: Secondary | ICD-10-CM | POA: Diagnosis not present

## 2018-10-12 DIAGNOSIS — R1013 Epigastric pain: Secondary | ICD-10-CM | POA: Diagnosis not present

## 2018-10-12 DIAGNOSIS — K219 Gastro-esophageal reflux disease without esophagitis: Secondary | ICD-10-CM | POA: Diagnosis not present

## 2018-10-15 ENCOUNTER — Other Ambulatory Visit: Payer: Self-pay | Admitting: Gastroenterology

## 2018-10-15 DIAGNOSIS — R1011 Right upper quadrant pain: Secondary | ICD-10-CM

## 2018-10-17 ENCOUNTER — Ambulatory Visit (HOSPITAL_COMMUNITY)
Admission: RE | Admit: 2018-10-17 | Discharge: 2018-10-17 | Disposition: A | Discharge status: Home / Self Care | Payer: 59 | Source: Ambulatory Visit | Attending: Gastroenterology | Admitting: Gastroenterology

## 2018-10-17 DIAGNOSIS — R1011 Right upper quadrant pain: Secondary | ICD-10-CM | POA: Insufficient documentation

## 2018-10-17 MED ORDER — TECHNETIUM TC 99M MEBROFENIN IV KIT
5.0000 | PACK | Freq: Once | INTRAVENOUS | Status: AC | PRN
  Administered 2018-10-17: 5 via INTRAVENOUS

## 2018-10-22 DIAGNOSIS — Z8619 Personal history of other infectious and parasitic diseases: Secondary | ICD-10-CM | POA: Insufficient documentation

## 2018-10-24 ENCOUNTER — Other Ambulatory Visit: Payer: Self-pay | Admitting: Internal Medicine

## 2018-10-24 DIAGNOSIS — R109 Unspecified abdominal pain: Secondary | ICD-10-CM

## 2018-11-01 ENCOUNTER — Ambulatory Visit
Admission: RE | Admit: 2018-11-01 | Discharge: 2018-11-01 | Disposition: A | Discharge status: Home / Self Care | Payer: 59 | Source: Ambulatory Visit | Attending: Internal Medicine | Admitting: Internal Medicine

## 2018-11-01 ENCOUNTER — Other Ambulatory Visit: Payer: Self-pay | Admitting: Internal Medicine

## 2018-11-01 DIAGNOSIS — R109 Unspecified abdominal pain: Secondary | ICD-10-CM

## 2018-11-01 MED ORDER — IOPAMIDOL (ISOVUE-300) INJECTION 61%
100.0000 mL | Freq: Once | INTRAVENOUS | Status: AC | PRN
  Administered 2018-11-01: 100 mL via INTRAVENOUS

## 2020-07-05 IMAGING — NM NM HEPATO W/GB/PHARM/[PERSON_NAME]
2 series · 12 of 12 positions shown · non-contrast
Comparison: Ultrasound 10/10/2018

CLINICAL DATA: RIGHT upper quadrant pain

EXAM:
NUCLEAR MEDICINE HEPATOBILIARY IMAGING WITH GALLBLADDER EF
TECHNIQUE: Sequential images of the abdomen were obtained [DATE] minutes
following intravenous administration of radiopharmaceutical. After
oral ingestion of Ensure, gallbladder ejection fraction was
determined. At 60 min, normal ejection fraction is greater than 33%.
RADIOPHARMACEUTICALS:  5.5 mCi Pc-55m  Choletec IV

[he hepatobiliary · 4.52mm/px · 6 of 60 frames shown (1 of 2)]
[frame 6/60]
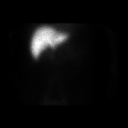
[frame 16/60]
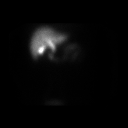
[frame 26/60]
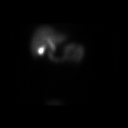
[frame 36/60]
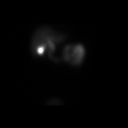
[frame 46/60]
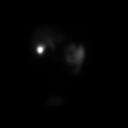
[frame 56/60]
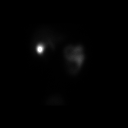

[he hepatobiliary · 4.52mm/px · 6 of 60 frames shown (2 of 2)]
[frame 6/60]
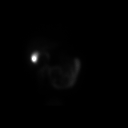
[frame 16/60]
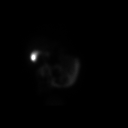
[frame 26/60]
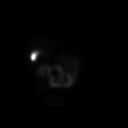
[frame 36/60]
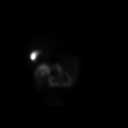
[frame 46/60]
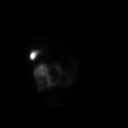
[frame 56/60]
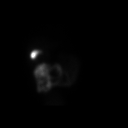

[12 of 12 positions shown; findings below may reference images not displayed]

FINDINGS: Prompt uptake and biliary excretion of activity by the liver is
seen. Gallbladder activity is visualized, consistent with patency of
cystic duct. Biliary activity passes into small bowel, consistent
with patent common bile duct.

Calculated gallbladder ejection fraction is 46%. (Normal gallbladder
ejection fraction with Ensure is greater than 33%.)
IMPRESSION: 1. Normal gallbladder filling. Patent cystic duct and common bile
duct.
2. Normal gallbladder ejection fraction

## 2022-06-15 ENCOUNTER — Other Ambulatory Visit: Payer: Self-pay

## 2022-06-15 ENCOUNTER — Encounter (HOSPITAL_COMMUNITY): Payer: Self-pay | Admitting: Emergency Medicine

## 2022-06-15 ENCOUNTER — Ambulatory Visit (HOSPITAL_COMMUNITY)
Admission: EM | Admit: 2022-06-15 | Discharge: 2022-06-15 | Disposition: A | Discharge status: Home / Self Care | Payer: 59

## 2022-06-15 DIAGNOSIS — T148XXA Other injury of unspecified body region, initial encounter: Secondary | ICD-10-CM

## 2022-06-15 NOTE — ED Triage Notes (Addendum)
Patient reports penile pain after intercourse last night.  Patient used protection.  Reports no difficulty with urination.  No pain with ambulation.  There is a burning pain to touch or to touch with water

## 2022-06-15 NOTE — ED Provider Notes (Signed)
[place]    CSN: [id number] Arrival date & time: 06/15/22  1836      History   Chief Complaint Chief Complaint  Patient presents with   Groin Pain    HPI Christopher Little is a 31 y.o. male.   HPI 31 year old male presents with penile pain since intercourse last night patient reports using a condom.  Patient denies any other concern or complaint this evening.  PMH significant for obesity, PUD, and GERD.  History reviewed. No pertinent past medical history.  Patient Active Problem List   Diagnosis Date Noted   PUD (peptic ulcer disease) 09/26/2018   Positive H. pylori test 09/26/2018   Gastroesophageal reflux disease without esophagitis 08/28/2018   Heartburn 08/28/2018    History reviewed. No pertinent surgical history.     Home Medications    Prior to Admission medications   Medication Sig Start Date End Date Taking? Authorizing Provider  amoxicillin-clarithromycin-lansoprazole [facility]) combo pack Take by mouth 2 (two) times daily. Follow package directions. Patient not taking: Reported on 09/26/2018 08/31/18   Horald Pollen, MD  omeprazole (PRILOSEC) 40 MG capsule Take 1 capsule (40 mg total) by mouth daily. 09/26/18   Horald Pollen, MD    Family History History reviewed. No pertinent family history.  Social History Social History   Tobacco Use   Smoking status: Never   Smokeless tobacco: Never  Vaping Use   Vaping Use: Never used  Substance Use Topics   Alcohol use: Yes   Drug use: Never     Allergies   Patient has no known allergies.   Review of Systems Review of Systems  Genitourinary:  Positive for penile pain.       Groin pain x  All other systems reviewed and are negative.    Physical Exam Triage Vital Signs ED Triage Vitals  Enc Vitals Group     BP      Pulse      Resp      Temp      Temp src      SpO2      Weight      Height      Head Circumference      Peak Flow      Pain Score      Pain Loc       Pain Edu?      Excl. in [place]?    No data found.  Updated Vital Signs BP 129/82 (BP Location: Left Arm)   Pulse 78   Temp 98 F (36.7 C) (Oral)   Resp 18   SpO2 98%    Physical Exam Vitals and nursing note reviewed.  Constitutional:      General: He is not in acute distress.    Appearance: Normal appearance. He is normal weight. He is not ill-appearing.  HENT:     Head: Normocephalic and atraumatic.     Mouth/Throat:     Mouth: Mucous membranes are moist.     Pharynx: Oropharynx is clear.  Eyes:     Extraocular Movements: Extraocular movements intact.     Conjunctiva/sclera: Conjunctivae normal.     Pupils: Pupils are equal, round, and reactive to light.  Cardiovascular:     Rate and Rhythm: Normal rate and regular rhythm.     Pulses: Normal pulses.     Heart sounds: Normal heart sounds.  Pulmonary:     Effort: Pulmonary effort is normal.     Breath sounds: Normal breath sounds.  No wheezing, rhonchi or rales.  Genitourinary:    Penis: Normal.      Comments: microscopic nonbleeding/nondraining/nonerythematous skin avulsion noted on ventral aspect of glans  Musculoskeletal:        General: Normal range of motion.     Cervical back: Normal range of motion and neck supple. No tenderness.  Lymphadenopathy:     Cervical: No cervical adenopathy.  Skin:    General: Skin is warm and dry.  Neurological:     General: No focal deficit present.     Mental Status: He is alert and oriented to person, place, and time.      UC Treatments / Results  Labs (all labs ordered are listed, but only abnormal results are displayed) Labs Reviewed - No data to display  EKG   Radiology No results found.  Procedures Procedures (including critical care time)  Medications Ordered in UC Medications - No data to display  Initial Impression / Assessment and Plan / UC Course  I have reviewed the triage vital signs and the nursing notes.  Pertinent labs & imaging results that were  available during my care of the patient were reviewed by me and considered in my medical decision making (see chart for details).     MDM: Skin avulsion-microscopic skin avulsion noted on ventral aspect of glans. Advised patient to allow microscopic skin avulsion to heal on its own/by secondary intention to allow re-granularization/regrowth of epithelium of skin in this area.  Advised patient if symptoms worsen and/or unresolved please follow-up with PCP or here for further evaluation.  Patient discharged home, hemodynamically stable. Final Clinical Impressions(s) / UC Diagnoses   Final diagnoses:  Skin avulsion     Discharge Instructions      Advised patient to allow microscopic skin avulsion to heal on its own/by secondary intention to allow re-granularization/regrowth of epithelium of skin in this area.  Advised patient if symptoms worsen and/or unresolved please follow-up with PCP or here for further evaluation.     ED Prescriptions   None    PDMP not reviewed this encounter.   Trevor Iha, FNP 06/15/22 2024

## 2022-06-15 NOTE — Discharge Instructions (Addendum)
Advised patient to allow microscopic skin avulsion to heal on its own/by secondary intention to allow re-granularization/regrowth of epithelium of skin in this area.  Advised patient if symptoms worsen and/or unresolved please follow-up with PCP or here for further evaluation.

## 2024-06-02 ENCOUNTER — Emergency Department (HOSPITAL_COMMUNITY)

## 2024-06-02 ENCOUNTER — Encounter (HOSPITAL_COMMUNITY): Payer: Self-pay

## 2024-06-02 ENCOUNTER — Observation Stay (HOSPITAL_COMMUNITY)
Admission: EM | Admit: 2024-06-02 | Discharge: 2024-06-03 | Disposition: A | Discharge status: Home / Self Care | Attending: Emergency Medicine | Admitting: Emergency Medicine

## 2024-06-02 ENCOUNTER — Other Ambulatory Visit: Payer: Self-pay

## 2024-06-02 DIAGNOSIS — Z6835 Body mass index (BMI) 35.0-35.9, adult: Secondary | ICD-10-CM | POA: Insufficient documentation

## 2024-06-02 DIAGNOSIS — R072 Precordial pain: Secondary | ICD-10-CM | POA: Diagnosis not present

## 2024-06-02 DIAGNOSIS — E66812 Obesity, class 2: Secondary | ICD-10-CM | POA: Insufficient documentation

## 2024-06-02 DIAGNOSIS — I200 Unstable angina: Secondary | ICD-10-CM | POA: Diagnosis not present

## 2024-06-02 DIAGNOSIS — R7989 Other specified abnormal findings of blood chemistry: Secondary | ICD-10-CM | POA: Diagnosis not present

## 2024-06-02 DIAGNOSIS — R079 Chest pain, unspecified: Principal | ICD-10-CM

## 2024-06-02 LAB — CBC
HCT: 42.4 % (ref 39.0–52.0)
HCT: 41.9 % (ref 39.0–52.0)
Hemoglobin: 13.9 g/dL (ref 13.0–17.0)
Hemoglobin: 13.5 g/dL (ref 13.0–17.0)
MCH: 28.3 pg (ref 26.0–34.0)
MCH: 28.1 pg (ref 26.0–34.0)
MCHC: 32.8 g/dL (ref 30.0–36.0)
MCHC: 32.2 g/dL (ref 30.0–36.0)
MCV: 86.2 fL (ref 80.0–100.0)
MCV: 87.1 fL (ref 80.0–100.0)
Platelets: 294 K/uL (ref 150–400)
Platelets: 287 K/uL (ref 150–400)
RBC: 4.92 MIL/uL (ref 4.22–5.81)
RBC: 4.81 MIL/uL (ref 4.22–5.81)
RDW: 14 % (ref 11.5–15.5)
RDW: 13.9 % (ref 11.5–15.5)
WBC: 6.3 K/uL (ref 4.0–10.5)
WBC: 5.1 K/uL (ref 4.0–10.5)
nRBC: 0 % (ref 0.0–0.2)
nRBC: 0 % (ref 0.0–0.2)

## 2024-06-02 LAB — TROPONIN I (HIGH SENSITIVITY)
Troponin I (High Sensitivity): 20 ng/L — ABNORMAL HIGH (ref <18)
Troponin I (High Sensitivity): 21 ng/L — ABNORMAL HIGH (ref <18)
Troponin I (High Sensitivity): 17 ng/L (ref <18)
Troponin I (High Sensitivity): 20 ng/L — ABNORMAL HIGH (ref <18)

## 2024-06-02 LAB — BASIC METABOLIC PANEL WITH GFR
Anion gap: 11 (ref 5–15)
BUN: 9 mg/dL (ref 6–20)
CO2: 22 mmol/L (ref 22–32)
Calcium: 8.9 mg/dL (ref 8.9–10.3)
Chloride: 105 mmol/L (ref 98–111)
Creatinine, Ser: 1.19 mg/dL (ref 0.61–1.24)
GFR, Estimated: >60 mL/min (ref >60)
Glucose, Bld: 111 mg/dL — ABNORMAL HIGH (ref 70–99)
Potassium: 3.9 mmol/L (ref 3.5–5.1)
Sodium: 138 mmol/L (ref 135–145)

## 2024-06-02 LAB — HEMOGLOBIN A1C
Hgb A1c MFr Bld: 5.3 % (ref 4.8–5.6)
Mean Plasma Glucose: 105.41 mg/dL

## 2024-06-02 LAB — I-STAT CHEM 8, ED
BUN: 9 mg/dL (ref 6–20)
Calcium, Ion: 1.16 mmol/L (ref 1.15–1.40)
Chloride: 103 mmol/L (ref 98–111)
Creatinine, Ser: 1.3 mg/dL — ABNORMAL HIGH (ref 0.61–1.24)
Glucose, Bld: 109 mg/dL — ABNORMAL HIGH (ref 70–99)
HCT: 42 % (ref 39.0–52.0)
Hemoglobin: 14.3 g/dL (ref 13.0–17.0)
Potassium: 3.5 mmol/L (ref 3.5–5.1)
Sodium: 139 mmol/L (ref 135–145)
TCO2: 23 mmol/L (ref 22–32)

## 2024-06-02 LAB — BRAIN NATRIURETIC PEPTIDE: B Natriuretic Peptide: 35.7 pg/mL (ref 0.0–100.0)

## 2024-06-02 LAB — CREATININE, SERUM
Creatinine, Ser: 1.18 mg/dL (ref 0.61–1.24)
GFR, Estimated: >60 mL/min (ref >60)

## 2024-06-02 LAB — D-DIMER, QUANTITATIVE: D-Dimer, Quant: <0.27 ug{FEU}/mL (ref 0.00–0.50)

## 2024-06-02 LAB — SEDIMENTATION RATE: Sed Rate: 10 mm/h (ref 0–16)

## 2024-06-02 LAB — C-REACTIVE PROTEIN: CRP: 0.7 mg/dL (ref <1.0)

## 2024-06-02 MED ORDER — ACETAMINOPHEN 325 MG PO TABS
650.0000 mg | ORAL_TABLET | ORAL | Status: DC | PRN
  Administered 2024-06-02: 650 mg via ORAL
  Filled 2024-06-02: qty 2

## 2024-06-02 MED ORDER — DICYCLOMINE HCL 10 MG PO CAPS
10.0000 mg | ORAL_CAPSULE | Freq: Once | ORAL | Status: AC
  Administered 2024-06-02: 10 mg via ORAL
  Filled 2024-06-02: qty 1

## 2024-06-02 MED ORDER — ONDANSETRON HCL 4 MG/2ML IJ SOLN: 4.0000 mg | Freq: Four times a day (QID) | INTRAMUSCULAR | Status: DC | PRN

## 2024-06-02 MED ORDER — ALUM & MAG HYDROXIDE-SIMETH 200-200-20 MG/5ML PO SUSP
30.0000 mL | Freq: Once | ORAL | Status: AC
Start: 2024-06-02 — End: 2024-06-02
  Administered 2024-06-02: 30 mL via ORAL
  Filled 2024-06-02: qty 30

## 2024-06-02 MED ORDER — METOPROLOL TARTRATE 50 MG PO TABS
50.0000 mg | ORAL_TABLET | Freq: Once | ORAL | Status: AC
  Administered 2024-06-03: 50 mg via ORAL
  Filled 2024-06-02: qty 1

## 2024-06-02 MED ORDER — ENOXAPARIN SODIUM 40 MG/0.4ML IJ SOSY
40.0000 mg | PREFILLED_SYRINGE | INTRAMUSCULAR | Status: DC
  Administered 2024-06-02 – 2024-06-03 (×2): 40 mg via SUBCUTANEOUS
  Filled 2024-06-02 (×2): qty 0.4

## 2024-06-02 MED ORDER — NITROGLYCERIN 0.4 MG SL SUBL: 0.4000 mg | SUBLINGUAL_TABLET | SUBLINGUAL | Status: DC | PRN

## 2024-06-02 MED ORDER — HYOSCYAMINE SULFATE 0.125 MG SL SUBL
0.2500 mg | SUBLINGUAL_TABLET | Freq: Once | SUBLINGUAL | Status: AC
  Administered 2024-06-02: 0.25 mg via SUBLINGUAL
  Filled 2024-06-02: qty 2

## 2024-06-02 NOTE — H&P (Signed)
 Cardiology Admission History and Physical   Patient ID: Christopher Little MRN: [id number]; DOB: 1991-01-30   Admission date: 06/02/2024  PCP:  Patient, No Pcp Per   [facility] Providers Cardiologist:  None       Chief Complaint:  Chest Pain  Patient Profile: Christopher Little is a 33 y.o. male with family history of CAD, obesity who is being seen 06/02/2024 for the evaluation of Chest pain.  History of Present Illness: Mr. Christopher Little descent gentleman who presented to the ED for evaluation of chest pain.  On 05/31/2024 patient was in his usual state of health.  Went to the gym for approximately 1.5 hours did 45 minutes of cardiovascular workout along with free weights.  Shortly thereafter he started having chest pain and shortness of breath and mild left-sided chest pain.  He did not think much of it and went home and went to sleep.  On 06/01/2024 he started to have chest pain and shortness of breath predominantly with effort related activities.  Discomfort is left-sided, intensity 7 out of 10, at times with effort related activities and improves with resting.  Patient states that he has also been stressed due long working hours.  The pain is not positional or pleuritic.  The pain is not brought on by palpation.  No sick contacts.  No prolonged car rides or plane rides.  Strong family history of cardiac disease.  Father died at the age of 89 due to a massive heart attack.  Paternal uncle also has heart disease at a relatively young age.  Patient states that in hindsight the precordial discomfort started when he increased speed on the treadmill, more than what he is used to.  Past medical history: Obesity  Past surgical history: None, per patient  Medications Prior to Admission: Prior to Admission medications   Medication Sig Start Date End Date Taking? Authorizing Provider  albuterol (VENTOLIN HFA) 108 (90 Base) MCG/ACT inhaler Inhale 2 puffs into the lungs every 4 (four) hours as  needed. 02/06/24  Yes [provider]  amoxicillin  (AMOXIL ) 500 MG capsule Take 500 mg by mouth 3 (three) times daily. 03/21/24  Yes [provider]  amoxicillin -clarithromycin -lansoprazole  (PREVPAC) combo pack Take by mouth 2 (two) times daily. Follow package directions. Patient not taking: Reported on 09/26/2018 08/31/18   Purcell Emil Schanz, MD  omeprazole  (PRILOSEC) 40 MG capsule Take 1 capsule (40 mg total) by mouth daily. 09/26/18   Purcell Emil Schanz, MD     Allergies:   No Known Allergies  Social History:   Married, 1 daughter No smoking, drugs, or routine alcohol.   Family History:   Dad - 36 years old passed away due to MI  Paternal uncle - alive but heart disease   ROS:  Review of Systems  Cardiovascular:  Positive for chest pain and dyspnea on exertion. Negative for claudication, irregular heartbeat, leg swelling, near-syncope, orthopnea, palpitations, paroxysmal nocturnal dyspnea and syncope.  Hematologic/Lymphatic: Negative for bleeding problem.  Neurological:  Positive for headaches.    Physical Exam/Data: Vitals:   06/02/24 0630 06/02/24 0645 06/02/24 0700 06/02/24 0715  BP: 120/83 103/66 111/70 111/72  Pulse: 66 70 67 64  Resp: (!) 23 18 (!) 23 19  Temp:      SpO2: 100% 100% 100% 100%  Weight:      Height:       No intake or output data in the 24 hours ending 06/02/24 0804    06/02/2024    3:21 AM 09/26/2018  2:23 PM 08/28/2018    2:23 PM  Last 3 Weights  Weight (lbs) 273 lb 223 lb 229 lb 6.4 oz  Weight (kg) 123.832 kg 101.152 kg 104.055 kg     Body mass index is 35.05 kg/m.  General:  Well nourished, well developed, in no acute distress HEENT: normal Neck: noJVD Vascular: No carotid bruits; Distal pulses 2+ bilaterally   Cardiac:  normal S1, S2; RRR; no murmur, gallop, rub Lungs:  clear to auscultation bilaterally, no wheezing, rhonchi or rales  Abd: soft, nontender, no hepatomegaly  Ext: no edema Musculoskeletal:  No  deformities, BUE and BLE strength normal and equal Skin: warm and dry  Neuro:  CNs 2-12 intact, no focal abnormalities noted Psych:  Normal affect   EKG:  The ECG that was done 06/02/2024 was personally reviewed and demonstrates sinus rhythm, 87 bpm, isolated T wave inversion in lead III.  No significant change when compared to prior tracing  Relevant CV Studies: None  Laboratory Data: High Sensitivity Troponin:   Recent Labs  Lab 06/02/24 0330 06/02/24 0604  TROPONINIHS 20* 21*      Chemistry Recent Labs  Lab 06/02/24 0329 06/02/24 0330  NA 139 138  K 3.5 3.9  CL 103 105  CO2  --  22  GLUCOSE 109* 111*  BUN 9 9  CREATININE 1.30* 1.19  CALCIUM  --  8.9  GFRNONAA  --  >60  ANIONGAP  --  11    No results for input(s): PROT, ALBUMIN, AST, ALT, ALKPHOS, BILITOT in the last 168 hours. Lipids No results for input(s): CHOL, TRIG, HDL, LABVLDL, LDLCALC, CHOLHDL in the last 168 hours. Hematology Recent Labs  Lab 06/02/24 0329 06/02/24 0330  WBC  --  6.3  RBC  --  4.92  HGB 14.3 13.9  HCT 42.0 42.4  MCV  --  86.2  MCH  --  28.3  MCHC  --  32.8  RDW  --  14.0  PLT  --  294   Thyroid No results for input(s): TSH, FREET4 in the last 168 hours. BNPNo results for input(s): BNP, PROBNP in the last 168 hours.  DDimer No results for input(s): DDIMER in the last 168 hours.  Radiology/Studies:  DG Chest 2 View Result Date: 06/02/2024 CLINICAL DATA:  Chest pain and shortness of breath EXAM: CHEST - 2 VIEW COMPARISON:  08/14/2018 FINDINGS: The heart size and mediastinal contours are within normal limits. Both lungs are clear. The visualized skeletal structures are unremarkable. IMPRESSION: No active cardiopulmonary disease. Electronically Signed   By: Oneil Devonshire M.D.   On: 06/02/2024 03:37   Assessment and Plan: Precordial pain Symptoms concerning for cardiac discomfort.  Symptoms are brought on by overexertion.  Patient states that he is  trying to lose weight and therefore was going longer and faster on the treadmill when he noticed symptoms EKG: No significant change compared to prior, isolated TWI in lead III High sensitive troponins slightly elevated but essentially flat Currently chest pain-free Risk factors: Obesity and family history Echo will be ordered to evaluate for structural heart disease and left ventricular systolic function. Coronary CTA to evaluate for CAC, plaque burden, and obstructive disease Will order Lopressor  50 mg x 1 7 AM tomorrow morning Check BNP, D-dimer, ESR CRP to rule out inflammatory processes Risk stratification: Check fasting lipids and A1c Hold off on IV heparin for now. Trend troponins, if they continue to trend up or he has recurrence of chest pain IV heparin can be  considered. GI cocktail  Elevated troponin: Likely supply demand ischemia but underlying CAD cannot be ruled out  Obesity due to excess calories. Body mass index is 35.05 kg/m. The patient has started to implement lifestyle changes to help facilitate weight loss.   Risk Assessment/Risk Scores:     Code Status: Full Code  Severity of Illness: The appropriate patient status for this patient is OBSERVATION. Observation status is judged to be reasonable and necessary in order to provide the required intensity of service to ensure the patient's safety. The patient's presenting symptoms, physical exam findings, and initial radiographic and laboratory data in the context of their medical condition is felt to place them at decreased risk for further clinical deterioration. Furthermore, it is anticipated that the patient will be medically stable for discharge from the hospital within 2 midnights of admission.   For questions or updates, please contact [facility] Please consult [email or web address] for contact info under       Signed, Gian Ybarra, DO  06/02/2024 8:04 AM

## 2024-06-02 NOTE — ED Provider Notes (Signed)
  Physical Exam  BP 111/72   Pulse 64   Temp (!) 97.1 F (36.2 C)   Resp 19   Ht 6' 2 (1.88 m)   Wt 123.8 kg   SpO2 100%   BMI 35.05 kg/m   Physical Exam Vitals and nursing note reviewed.  Constitutional:      General: He is not in acute distress.    Appearance: Normal appearance.  HENT:     Head: Normocephalic and atraumatic.  Eyes:     General:        Right eye: No discharge.        Left eye: No discharge.  Cardiovascular:     Comments: Regular rate and rhythm.  S1/S2 are distinct without any evidence of murmur, rubs, or gallops.  Radial pulses are 2+ bilaterally.  Dorsalis pedis pulses are 2+ bilaterally.  No evidence of pedal edema. Pulmonary:     Comments: Clear to auscultation bilaterally.  Normal effort.  No respiratory distress.  No evidence of wheezes, rales, or rhonchi heard throughout. Abdominal:     General: Abdomen is flat. Bowel sounds are normal. There is no distension.     Tenderness: There is no abdominal tenderness. There is no guarding or rebound.  Musculoskeletal:        General: Normal range of motion.     Cervical back: Neck supple.  Skin:    General: Skin is warm and dry.     Findings: No rash.  Neurological:     General: No focal deficit present.     Mental Status: He is alert.  Psychiatric:        Mood and Affect: Mood normal.        Behavior: Behavior normal.     Procedures  Procedures  ED Course / MDM    Medical Decision Making Risk Decision regarding hospitalization.   Accepted handoff at shift change from Sponseller PA-C. Please see prior provider note for more detail.   Briefly: Patient is 33 y.o. male patient who presents to the ED with chest pain with exertion. Currently chest pain free.   DDX: concern for ACS with elevated troponins x2. Currently chest pain free.   Plan: Plan to admit to trend troponins. Spoke with Dr. Michele with cardiology who agrees to admit. Patient is full code.              Christopher Little  Crocker, PA-C 06/02/24 9241    Dasie Faden, MD 06/04/24 [phone]

## 2024-06-02 NOTE — ED Notes (Signed)
 Pt alert, NAD, calm, interactive, resps e/u, speaking in clear complete sentences, VSS. Up to b/r, steady gait. Family at [facility].

## 2024-06-02 NOTE — ED Triage Notes (Signed)
 Pt reports chest pain onset yesterday when he was doing cardio at the gym associated with dizziness, shortness of breath, headache and lightheadedness.

## 2024-06-02 NOTE — ED Provider Notes (Signed)
 [facility]PART[facility] AT [facility] Provider Note   CSN: [id number] Arrival date & time: 06/02/24  0301     Patient presents with: Shortness of Breath and Headache   Christopher Little is a 33 y.o. male who presents with 2 days of exertional chest pain.  States that it started this week when he was running on the treadmill and would resolve when he was resting.  Yesterday began to feel short of breath and chest pressure when he was walking, did resolve when he was resting.  Pain was 3 out of 10 when he arrived to the ED this morning but increased to 8 out of 10 when he ambulates per patient. Hx of PUD, no meds daily per patient.  Father had MI which precipitated his death, history of CVA in his father as well.  No recent travel, immobilization, history of DVT, or malignancy.   HPI     Prior to Admission medications   Medication Sig Start Date End Date Taking? Authorizing Provider  amoxicillin -clarithromycin -lansoprazole  (PREVPAC) combo pack Take by mouth 2 (two) times daily. Follow package directions. Patient not taking: Reported on 09/26/2018 08/31/18   Purcell Emil Schanz, MD  omeprazole  (PRILOSEC) 40 MG capsule Take 1 capsule (40 mg total) by mouth daily. 09/26/18   Purcell Emil Schanz, MD    Allergies: Patient has no known allergies.    Review of Systems  Respiratory: Negative.    Cardiovascular:  Positive for chest pain. Negative for palpitations and leg swelling.    Updated Vital Signs BP 117/84   Pulse 77   Temp (!) 97.1 F (36.2 C)   Resp 20   Ht 6' 2 (1.88 m)   Wt 123.8 kg   SpO2 100%   BMI 35.05 kg/m   Physical Exam Vitals and nursing note reviewed.  Constitutional:      Appearance: He is not ill-appearing or toxic-appearing.  HENT:     Head: Normocephalic and atraumatic.     Mouth/Throat:     Mouth: Mucous membranes are moist.     Pharynx: No oropharyngeal exudate or posterior oropharyngeal erythema.  Eyes:     General:        Right  eye: No discharge.        Left eye: No discharge.     Conjunctiva/sclera: Conjunctivae normal.  Cardiovascular:     Rate and Rhythm: Normal rate and regular rhythm.     Pulses: Normal pulses.  Pulmonary:     Effort: Pulmonary effort is normal. No respiratory distress.     Breath sounds: Normal breath sounds. No wheezing or rales.  Abdominal:     General: Bowel sounds are normal. There is no distension.     Tenderness: There is no abdominal tenderness.  Musculoskeletal:        General: No deformity.     Cervical back: Neck supple.  Skin:    General: Skin is warm and dry.  Neurological:     Mental Status: He is alert. Mental status is at baseline.  Psychiatric:        Mood and Affect: Mood normal.     (all labs ordered are listed, but only abnormal results are displayed) Labs Reviewed  BASIC METABOLIC PANEL WITH GFR - Abnormal; Notable for the following components:      Result Value   Glucose, Bld 111 (*)    All other components within normal limits  I-STAT CHEM 8, ED - Abnormal; Notable for the following components:  Creatinine, Ser 1.30 (*)    Glucose, Bld 109 (*)    All other components within normal limits  TROPONIN I (HIGH SENSITIVITY) - Abnormal; Notable for the following components:   Troponin I (High Sensitivity) 20 (*)    All other components within normal limits  TROPONIN I (HIGH SENSITIVITY) - Abnormal; Notable for the following components:   Troponin I (High Sensitivity) 21 (*)    All other components within normal limits  CBC    EKG: EKG Interpretation Date/Time:  Sunday June 02 2024 03:16:56 EDT Ventricular Rate:  87 PR Interval:  140 QRS Duration:  80 QT Interval:  372 QTC Calculation: 447 R Axis:   14  Text Interpretation: Normal sinus rhythm Normal ECG When compared with ECG of 14-Aug-2018 16:55, PREVIOUS ECG IS PRESENT Confirmed by Bari Pfeiffer ([id number]) on 06/02/2024 5:28:15 AM  Radiology: [facility] Chest 2 View Result Date:  06/02/2024 CLINICAL DATA:  Chest pain and shortness of breath EXAM: CHEST - 2 VIEW COMPARISON:  08/14/2018 FINDINGS: The heart size and mediastinal contours are within normal limits. Both lungs are clear. The visualized skeletal structures are unremarkable. IMPRESSION: No active cardiopulmonary disease. Electronically Signed   By: Oneil Devonshire M.D.   On: 06/02/2024 03:37     Procedures   Medications Ordered in the ED - No data to display                                  Medical Decision Making 33 y/o male with exertional CP.  Vitals on intake.  PERC negative.  Cardiopulmonary abdominal exam is benign.  Patient well-appearing at time of my evaluation.  DDx includes but limited to ACS, PE, pleural effusion, pneumonia, pneumothorax, dysrhythmia.  Amount and/or Complexity of Data Reviewed Labs:     Details: CBC unremarkable, BMP unremarkable, Trope elevated to 20, repeat Trope elevated to 21. Radiology:     Details:   Chest x-ray unremarkable ECG/medicine tests:     Details:  EKG with normal sinus rhythm.   Care of this patient signed out to oncoming ED provider Theotis, PA-C at time of shift change. All pertinent HPI, physical exam, and laboratory findings were discussed with them prior to my departure. Disposition of patient pending completion of workup, reevaluation, and clinical judgement of oncoming ED provider. Pending Cardiology consult at time of shift change.   This chart was dictated using voice recognition software, Dragon. Despite the best efforts of this provider to proofread and correct errors, errors may still occur which can change documentation meaning.      Final diagnoses:  None    ED Discharge Orders     None          Bobette Pleasant JONELLE DEVONNA 06/02/24 0703    Bari Pfeiffer FALCON, MD 06/02/24 [phone]

## 2024-06-02 NOTE — ED Notes (Addendum)
 Pt states that he can only eat chicken and fish. Chart has been update to reflect such. Cafeteria contacted for new meal tray.

## 2024-06-02 NOTE — ED Notes (Signed)
 No changes. Cards MD at [facility]. Pt updated.

## 2024-06-02 NOTE — ED Triage Notes (Signed)
 Patient states yesterday he started feeling short of breath when he was talking. Today he started feeling dizzy head a headache. He states he also has chest pain on the left side of the chest. No medications. 3/10 chest pain. States his pain increases to an 8/10 when he stands up to walk.

## 2024-06-03 ENCOUNTER — Observation Stay (HOSPITAL_BASED_OUTPATIENT_CLINIC_OR_DEPARTMENT_OTHER)

## 2024-06-03 DIAGNOSIS — R079 Chest pain, unspecified: Principal | ICD-10-CM

## 2024-06-03 DIAGNOSIS — R072 Precordial pain: Secondary | ICD-10-CM

## 2024-06-03 DIAGNOSIS — R7989 Other specified abnormal findings of blood chemistry: Secondary | ICD-10-CM | POA: Diagnosis not present

## 2024-06-03 DIAGNOSIS — I200 Unstable angina: Principal | ICD-10-CM

## 2024-06-03 LAB — BASIC METABOLIC PANEL WITH GFR
Anion gap: 14 (ref 5–15)
BUN: 12 mg/dL (ref 6–20)
CO2: 20 mmol/L — ABNORMAL LOW (ref 22–32)
Calcium: 8.9 mg/dL (ref 8.9–10.3)
Chloride: 103 mmol/L (ref 98–111)
Creatinine, Ser: 1.22 mg/dL (ref 0.61–1.24)
GFR, Estimated: >60 mL/min (ref >60)
Glucose, Bld: 96 mg/dL (ref 70–99)
Potassium: 4.4 mmol/L (ref 3.5–5.1)
Sodium: 137 mmol/L (ref 135–145)

## 2024-06-03 LAB — LIPID PANEL
Cholesterol: 189 mg/dL (ref 0–200)
HDL: 26 mg/dL — ABNORMAL LOW (ref >40)
LDL Cholesterol: 112 mg/dL — ABNORMAL HIGH (ref 0–99)
Total CHOL/HDL Ratio: 7.3 ratio
Triglycerides: 255 mg/dL — ABNORMAL HIGH (ref <150)
VLDL: 51 mg/dL — ABNORMAL HIGH (ref 0–40)

## 2024-06-03 LAB — CBC
HCT: 46 % (ref 39.0–52.0)
Hemoglobin: 14.7 g/dL (ref 13.0–17.0)
MCH: 27.7 pg (ref 26.0–34.0)
MCHC: 32 g/dL (ref 30.0–36.0)
MCV: 86.6 fL (ref 80.0–100.0)
Platelets: 271 K/uL (ref 150–400)
RBC: 5.31 MIL/uL (ref 4.22–5.81)
RDW: 14.1 % (ref 11.5–15.5)
WBC: 5.7 K/uL (ref 4.0–10.5)
nRBC: 0 % (ref 0.0–0.2)

## 2024-06-03 LAB — ECHOCARDIOGRAM COMPLETE
Area-P 1/2: 4.89 cm2
Height: 74 in
S' Lateral: 2.8 cm
Weight: 4368 [oz_av]

## 2024-06-03 LAB — HIV ANTIBODY (ROUTINE TESTING W REFLEX): HIV Screen 4th Generation wRfx: NONREACTIVE

## 2024-06-03 MED ORDER — NITROGLYCERIN 0.4 MG SL SUBL
0.8000 mg | SUBLINGUAL_TABLET | Freq: Once | SUBLINGUAL | Status: AC
  Administered 2024-06-03: 0.8 mg via SUBLINGUAL

## 2024-06-03 MED ORDER — NITROGLYCERIN 0.4 MG SL SUBL
SUBLINGUAL_TABLET | SUBLINGUAL | Status: AC
  Filled 2024-06-03: qty 2

## 2024-06-03 MED ORDER — SODIUM CHLORIDE 0.9 % IV BOLUS
250.0000 mL | Freq: Once | INTRAVENOUS | Status: DC
Start: 2024-06-03 — End: 2024-06-03

## 2024-06-03 MED ORDER — IOHEXOL 350 MG/ML SOLN
100.0000 mL | Freq: Once | INTRAVENOUS | Status: AC | PRN
  Administered 2024-06-03: 100 mL via INTRAVENOUS

## 2024-06-03 NOTE — Plan of Care (Signed)

## 2024-06-03 NOTE — Discharge Summary (Signed)
 Discharge Summary   Patient ID: Christopher Little MRN: [id number]; DOB: [birth year]  Admit date: 06/02/2024 Discharge date: 06/03/2024  PCP:  Patient, No Pcp Per   [facility] Providers Cardiologist:  Madonna Large, DO     Discharge Diagnoses  Principal Problem:   Unstable angina [facility]) Active Problems:   Precordial chest pain   Elevated troponin   Class 2 obesity due to excess calories without serious comorbidity with body mass index (BMI) of 35.0 to 35.9 in adult   Precordial pain   Exertional chest pain  Diagnostic Studies/Procedures   Coronary CTA, 06/03/2024 Coronary calcium score of 0. This was 1st percentile for age-,sex, and race-matched controls. Normal coronary origin with right dominance. Normal coronary arteries with no evidence of coronary artery disease. CAD-RADS 0.  Consider non-ischemic causes of chest pain.  Echocardiogram, 06/03/2024 Left ventricular ejection fraction, by estimation, is 60 to 65%. The  left ventricle has normal function. The left ventricle has no regional  wall motion abnormalities. Left ventricular diastolic parameters were  normal.  Right ventricular systolic function is normal. The right ventricular  size is normal. There is normal pulmonary artery systolic pressure. The  estimated right ventricular systolic pressure is 25.1 mmHg. The mitral valve is grossly normal. Trivial mitral valve  regurgitation. No evidence of mitral stenosis. The aortic valve is tricuspid. Aortic valve regurgitation is not  visualized. No aortic stenosis is present.  The inferior vena cava is normal in size with greater than 50%  respiratory variability, suggesting right atrial pressure of 3 mmHg.   Conclusion(s)/Recommendation(s): Normal biventricular function without  evidence of hemodynamically significant valvular heart disease.  _____________   History of Present Illness   Christopher Little is a 33 y.o. male with past medical history of obesity was admitted to  undergo further workup and evaluation of chest pain.   Hospital Course   He was admitted to cardiology service. Troponin levels mildly elevated and flat at 20 ? 21 ? 20 ? 17. Due to strong family history of heart disease, CCTA was ordered to rule out obstructive CAD. Full report as above, but CCTA was negative for obstructive CAD with calcium score of 0.  Echocardiogram showed EF 60-65% normal BiV function.   He was seen by myself and Dr. Barbaraann this morning and deemed medically stable for discharge once CCTA was complete. LDL was 112, LPA pending.   Will make follow up with Dr. Large as an outpatient to see post-hospital and then determine need for follow up in the future.     Did the patient have an acute coronary syndrome (MI, NSTEMI, STEMI, etc) this admission?:  No                               Did the patient have a percutaneous coronary intervention (stent / angioplasty)?:  No.    Discharge Vitals Blood pressure 101/74, pulse (!) 56, temperature 98.3 F (36.8 C), temperature source Oral, resp. rate 16, height 6' 2 (1.88 m), weight 123.8 kg, SpO2 100%.  Filed Weights   06/02/24 0321  Weight: 123.8 kg    Labs & Radiologic Studies  CBC Recent Labs    06/02/24 0833 06/03/24 0918  WBC 5.1 5.7  HGB 13.5 14.7  HCT 41.9 46.0  MCV 87.1 86.6  PLT 287 271   Basic Metabolic Panel Recent Labs    90/85/74 0330 06/02/24 0833 06/03/24 0918  NA 138  --  137  K 3.9  --  4.4  CL 105  --  103  CO2 22  --  20*  GLUCOSE 111*  --  96  BUN 9  --  12  CREATININE 1.19 1.18 1.22  CALCIUM 8.9  --  8.9   Liver Function Tests No results for input(s): AST, ALT, ALKPHOS, BILITOT, PROT, ALBUMIN in the last 72 hours. No results for input(s): LIPASE, AMYLASE in the last 72 hours. High Sensitivity Troponin:   Recent Labs  Lab 06/02/24 0330 06/02/24 0604 06/02/24 0833  TROPONINIHS 20* 21* 17  20*    No results for input(s): TRNPT in the last 720 hours.  BNP Invalid  input(s): POCBNP No results for input(s): PROBNP in the last 72 hours.  Recent Labs    06/02/24 0330  BNP 35.7    D-Dimer Recent Labs    06/02/24 0906  DDIMER <0.27   Hemoglobin A1C Recent Labs    06/02/24 0330  HGBA1C 5.3   Fasting Lipid Panel Recent Labs    06/03/24 0918  CHOL 189  HDL 26*  LDLCALC 112*  TRIG 255*  CHOLHDL 7.3   No results found for: LIPOA  Thyroid Function Tests No results for input(s): TSH, T4TOTAL, T3FREE, THYROIDAB in the last 72 hours.  Invalid input(s): FREET3 _____________  ECHOCARDIOGRAM COMPLETE Result Date: 06/03/2024    ECHOCARDIOGRAM REPORT   Patient Name:   Christopher Little Date of Exam: 06/03/2024 Medical Rec #:  [id number]  Height:       74.0 in Accession #:    [id number] Weight:       273.0 lb Date of Birth:  Apr 12, 1991   BSA:          2.481 m Patient Age:    33 years   BP:           101/74 mmHg Patient Gender: M          HR:           64 bpm. Exam Location:  Inpatient Procedure: 2D Echo (Both Spectral and Color Flow Doppler were utilized during            procedure). Indications:    chest pain  History:        Patient has no prior history of Echocardiogram examinations.                 Signs/Symptoms:elevated troponin.  Sonographer:    Tinnie Barefoot RDCS Referring Phys: [id number] ARTIST POUCH IMPRESSIONS  1. Left ventricular ejection fraction, by estimation, is 60 to 65%. The left ventricle has normal function. The left ventricle has no regional wall motion abnormalities. Left ventricular diastolic parameters were normal.  2. Right ventricular systolic function is normal. The right ventricular size is normal. There is normal pulmonary artery systolic pressure. The estimated right ventricular systolic pressure is 25.1 mmHg.  3. The mitral valve is grossly normal. Trivial mitral valve regurgitation. No evidence of mitral stenosis.  4. The aortic valve is tricuspid. Aortic valve regurgitation is not visualized. No aortic stenosis is  present.  5. The inferior vena cava is normal in size with greater than 50% respiratory variability, suggesting right atrial pressure of 3 mmHg. Conclusion(s)/Recommendation(s): Normal biventricular function without evidence of hemodynamically significant valvular heart disease. FINDINGS  Left Ventricle: Left ventricular ejection fraction, by estimation, is 60 to 65%. The left ventricle has normal function. The left ventricle has no regional wall motion abnormalities. The left ventricular internal cavity size was normal in size. There is  no left ventricular hypertrophy. Left ventricular diastolic parameters were normal. Right Ventricle: The right ventricular size is normal. No increase in right ventricular wall thickness. Right ventricular systolic function is normal. There is normal pulmonary artery systolic pressure. The tricuspid regurgitant velocity is 2.35 m/s, and  with an assumed right atrial pressure of 3 mmHg, the estimated right ventricular systolic pressure is 25.1 mmHg. Left Atrium: Left atrial size was normal in size. Right Atrium: Right atrial size was normal in size. Pericardium: There is no evidence of pericardial effusion. Mitral Valve: The mitral valve is grossly normal. Trivial mitral valve regurgitation. No evidence of mitral valve stenosis. Tricuspid Valve: The tricuspid valve is grossly normal. Tricuspid valve regurgitation is trivial. No evidence of tricuspid stenosis. Aortic Valve: The aortic valve is tricuspid. Aortic valve regurgitation is not visualized. No aortic stenosis is present. Pulmonic Valve: The pulmonic valve was grossly normal. Pulmonic valve regurgitation is trivial. No evidence of pulmonic stenosis. Aorta: The aortic root and ascending aorta are structurally normal, with no evidence of dilitation. Venous: The right lower pulmonary vein is normal. The inferior vena cava is normal in size with greater than 50% respiratory variability, suggesting right atrial pressure of 3 mmHg.  IAS/Shunts: The atrial septum is grossly normal.  LEFT VENTRICLE PLAX 2D LVIDd:         4.80 cm   Diastology LVIDs:         2.80 cm   LV e' medial:    9.90 cm/s LV PW:         0.80 cm   LV E/e' medial:  6.8 LV IVS:        0.90 cm   LV e' lateral:   15.70 cm/s LVOT diam:     2.10 cm   LV E/e' lateral: 4.3 LV SV:         78 LV SV Index:   31 LVOT Area:     3.46 cm  RIGHT VENTRICLE             IVC RV S prime:     13.60 cm/s  IVC diam: 1.60 cm TAPSE (M-mode): 2.3 cm LEFT ATRIUM             Index        RIGHT ATRIUM           Index LA diam:        4.00 cm 1.61 cm/m   RA Area:     14.50 cm LA Vol (A2C):   52.7 ml 21.24 ml/m  RA Volume:   35.90 ml  14.47 ml/m LA Vol (A4C):   50.4 ml 20.32 ml/m LA Biplane Vol: 52.6 ml 21.20 ml/m  AORTIC VALVE LVOT Vmax:   109.00 cm/s LVOT Vmean:  71.700 cm/s LVOT VTI:    0.224 m  AORTA Ao Root diam: 3.10 cm Ao Asc diam:  3.20 cm MITRAL VALVE               TRICUSPID VALVE MV Area (PHT): 4.89 cm    TR Peak grad:   22.1 mmHg MV Decel Time: 155 msec    TR Vmax:        235.00 cm/s MV E velocity: 67.00 cm/s MV A velocity: 37.90 cm/s  SHUNTS MV E/A ratio:  1.77        Systemic VTI:  0.22 m  Systemic Diam: 2.10 cm Darryle Decent MD Electronically signed by Darryle Decent MD Signature Date/Time: 06/03/2024/2:49:19 PM    Final    CT CORONARY MORPH W/CTA COR W/SCORE DEL W/CM &/OR WO/CM Addendum Date: 06/03/2024 ADDENDUM REPORT: 06/03/2024 14:03 EXAM: OVER-READ INTERPRETATION  CT CHEST The following report is an over-read performed by radiologist Dr. Manford Breaker [facility] on 06/03/2024. This over-read does not include interpretation of cardiac or coronary anatomy or pathology. The coronary CTA interpretation by the cardiologist is attached. COMPARISON:  None Available. FINDINGS: Cardiovascular: Normal appearance of extracardiac vascular structures. Mediastinum/Nodes: Normal esophagus. No pathologically enlarged mediastinal or hilar lymph nodes.  Lungs/Pleura: The imaged central airways are patent. No focal consolidation. No pneumothorax. No pleural effusion. Upper abdomen: Normal. Musculoskeletal: No acute or abnormal lytic or blastic osseous lesions. IMPRESSION: No acute extracardiac findings. Electronically Signed   By: Limin  Xu M.D.   On: 06/03/2024 14:03   Result Date: 06/03/2024 CLINICAL DATA:  28M with obesity and chest pain. EXAM: Cardiac/Coronary  CT TECHNIQUE: The patient was scanned on a [employer]. PROTOCOL: A 120 kV prospective scan was triggered in the descending thoracic aorta at 111 HU's. Axial non-contrast 3 mm slices were carried out through the heart. The data set was analyzed on a dedicated work station and scored using the Agatson method. Gantry rotation speed was 250 msecs and collimation was .6 mm. No beta blockade and 0.8 mg of sl NTG was given. The 3D data set was reconstructed in 5% intervals of the 67-82 % of the R-R cycle. Diastolic phases were analyzed on a dedicated work station using MPR, MIP and VRT modes. The patient received 80 cc of contrast. FINDINGS: Aorta: Normal size. Ascending aorta 3.0 cm. No calcifications. No dissection. Aortic Valve:  Trileaflet.  No calcifications. Coronary Arteries:  Normal coronary origin.  Right dominance. RCA is a large dominant artery that gives rise to PDA and PLVB. There is no plaque. Left main is a large artery that gives rise to LAD and LCX arteries. LAD is a large vessel that has no plaque.  D1 has no plaque. LCX is a non-dominant artery that gives rise to a small OM1 and large OM2. There is no plaque. Coronary Calcium Score: 0 Other findings: Normal pulmonary vein drainage into the left atrium. Normal let atrial appendage without a thrombus. Normal size of the pulmonary artery. Slab recontruction artifact noted. Non-cardiac: See separate report from [facility]. IMPRESSION: 1. Coronary calcium score of 0. This was 1st percentile for age-, sex, and race-matched  controls. 2. Normal coronary origin with right dominance. 3. Normal coronary arteries with no evidence of coronary artery disease. CAD-RADS 0. 4.  Consider non-ischemic causes of chest pain. RECOMMENDATIONS: 1. CAD-RADS 0: No evidence of CAD (0%). Consider non-atherosclerotic causes of chest pain. Annabella Scarce, MD Electronically Signed: By: Annabella Scarce M.D. On: 06/03/2024 13:36   DG Chest 2 View Result Date: 06/02/2024 CLINICAL DATA:  Chest pain and shortness of breath EXAM: CHEST - 2 VIEW COMPARISON:  08/14/2018 FINDINGS: The heart size and mediastinal contours are within normal limits. Both lungs are clear. The visualized skeletal structures are unremarkable. IMPRESSION: No active cardiopulmonary disease. Electronically Signed   By: Oneil Devonshire M.D.   On: 06/02/2024 03:37    Disposition Pt is being discharged home today in good condition per MD.  Follow-up Plans & Appointments  Discharge Instructions     Call MD for:  difficulty breathing, headache or visual disturbances   Complete by:  As directed    Call MD for:  severe uncontrolled pain   Complete by: As directed    Discharge instructions   Complete by: As directed    Follow up appointment has been made with our office as per AVS      Future Appointments  Date Time Provider Department Center  06/18/2024  9:20 AM Michele Richardson, DO [facility]   Discharge Medications Allergies as of 06/03/2024       Reactions   Beef-derived Drug Products    No [facility] Food Allergies Other (See Comments)   Patient only eats chicken and fish. 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        Medication List     STOP taking these medications    amoxicillin  500 MG capsule Commonly known as: AMOXIL    amoxicillin -clarithromycin -lansoprazole  combo pack Commonly known as: Prevpac       TAKE these medications    albuterol 108 (90 Base) MCG/ACT inhaler Commonly known as: VENTOLIN HFA Inhale 2 puffs into the lungs every 4  (four) hours as needed.   OMEGA-3 COMPLEX PO Take 1 tablet by mouth See admin instructions. Patient states that he takes this medication infrequently   omeprazole  40 MG capsule Commonly known as: PRILOSEC Take 1 capsule (40 mg total) by mouth daily.        Outstanding Labs/Studies N/A  Duration of Discharge Encounter: APP Time: 15 minutes   Signed, Waddell DELENA Donath, PA-C 06/03/2024, 2:51 PM

## 2024-06-03 NOTE — TOC CM/SW Note (Signed)
 Transition of Care [facility]) - Inpatient Brief Assessment   Patient Details  Name: Christopher Little MRN: [id number] Date of Birth: 05-09-1991  [facility] CM/SW Contact:    Sudie Erminio Deems, RN Phone Number: 06/03/2024, 1:23 PM   Clinical Narrative: Patient presented for chest pain. Patient states he has friends and family support. Patient states he does not have a PCP; however, he wants to review providers himself. No further home needs identified at this time.     Transition of Care Asessment: Insurance and Status: Insurance coverage has been reviewed Patient has primary care physician: No Home environment has been reviewed: reviewed Prior level of function:: indpendent Prior/Current Home Services: No current home services Social Drivers of Health Review: SDOH reviewed no interventions necessary Readmission risk has been reviewed: Yes Transition of care needs: no transition of care needs at this time

## 2024-06-03 NOTE — Progress Notes (Signed)
  Echocardiogram 2D Echocardiogram has been performed.  Christopher Little 06/03/2024, 2:40 PM

## 2024-06-03 NOTE — Progress Notes (Signed)
  Progress Note  Patient Name: Christopher Little Date of Encounter: 06/03/2024 [facility] Cardiologist: Christopher Large, DO   Interval Summary   Resting comfortably this morning No symptoms but does note that he has been resting  Given beta-blocker this morning in preparation for CCTA Currently in sinus brady with HR 50s Awaiting CCTA and echo  Vital Signs Vitals:   06/02/24 1714 06/02/24 2111 06/03/24 0010 06/03/24 0502  BP: 114/80  99/63 105/63  Pulse:  85 60 79  Resp:  17 16 20   Temp: (!) 97.4 F (36.3 C) 98 F (36.7 C) 97.9 F (36.6 C) 97.7 F (36.5 C)  TempSrc: Oral Oral Oral Oral  SpO2: 100%  96% 100%  Weight:      Height:       No intake or output data in the 24 hours ending 06/03/24 0822     06/02/2024    3:21 AM 09/26/2018    2:23 PM 08/28/2018    2:23 PM  Last 3 Weights  Weight (lbs) 273 lb 223 lb 229 lb 6.4 oz  Weight (kg) 123.832 kg 101.152 kg 104.055 kg     Telemetry/ECG  Sinus bradycardia, HR 50s - Personally Reviewed  Physical Exam  GEN: No acute distress.   Neck: No JVD Cardiac: bradycardic, no murmurs, rubs, or gallops.  Respiratory: Clear to auscultation bilaterally. GI: Soft, nontender, non-distended  MS: No edema  Assessment & Plan   Chest pain Patient presented with chest pain, worse with exertion EKG without acute ischemic changes Troponin 20 ? 21 ? 20 ? 17 D-dimer negative ESR, CRP normal  BNP normal No active chest pain Pending echocardiogram Pending coronary CTA -- Lopressor  50 mg x 1 dose prior  Continue GI cocktail  Pending lipid panel  Medical Readiness Date: 06/03/2024    For questions or updates, please contact [facility] Please consult [email or web address] for contact info under         Signed, Christopher DELENA Donath, PA-C

## 2024-06-04 ENCOUNTER — Ambulatory Visit: Payer: Self-pay | Admitting: Cardiology

## 2024-06-04 LAB — LIPOPROTEIN A (LPA): Lipoprotein (a): 53.5 nmol/L — ABNORMAL HIGH (ref <75.0)

## 2024-06-05 NOTE — Progress Notes (Signed)
 Results sent to the patient active mychart.    Last read by Derick Both at 7:58PM on 06/04/2024.

## 2024-06-18 ENCOUNTER — Encounter: Payer: Self-pay | Admitting: Cardiology

## 2024-06-18 ENCOUNTER — Ambulatory Visit: Attending: Cardiology | Admitting: Cardiology

## 2024-06-18 VITALS — BP 108/72 | HR 71 | Resp 18 | Ht 74.0 in | Wt 270.1 lb

## 2024-06-18 DIAGNOSIS — R072 Precordial pain: Secondary | ICD-10-CM

## 2024-06-18 DIAGNOSIS — Z712 Person consulting for explanation of examination or test findings: Secondary | ICD-10-CM | POA: Diagnosis not present

## 2024-06-18 DIAGNOSIS — E781 Pure hyperglyceridemia: Secondary | ICD-10-CM

## 2024-06-18 NOTE — Progress Notes (Signed)
 Cardiology Office Note:  .   Date:  06/18/2024  ID:  Christopher Little, DOB 09/17/1991, MRN [id number] PCP:  Patient, No Pcp Per  Former Cardiology Providers: None [facility] Providers Cardiologist:  Madonna Large, DO , [facility] (established care 06/02/2024) Electrophysiologist:  None  Click to update primary MD,subspecialty MD or APP then REFRESH:1}    Chief Complaint  Patient presents with   Follow-up    Hospital follow-up, reevaluation of chest pain    History of Present Illness: .   Christopher Little is a 33 y.o. [place] descent male whose past medical history and cardiovascular risk factors includes: Family history of heart disease, obesity due to excess calories.  Patient was seen in the hospital on 06/02/2024 for chest pain.  His symptoms were concerning for cardiac discomfort.  EKG noted sinus rhythm with isolated TWI in lead III.  High sensitive troponins were slightly elevated but essentially flat.  Given his family history.  He was concerned and therefore we discussed either inpatient or outpatient workup.  Shared decision was to proceed with inpatient workup.  Patient underwent echocardiogram and coronary CTA and was later discharged home.  He presents today for follow-up.  Since hospital discharge patient denies any anginal chest pain or heart failure symptoms. He has started to increase physical activity with the intentions of losing weight and also implementing lifestyle modifications.  Review of Systems: .   Review of Systems  Cardiovascular:  Negative for chest pain, claudication, irregular heartbeat, leg swelling, near-syncope, orthopnea, palpitations, paroxysmal nocturnal dyspnea and syncope.  Respiratory:  Negative for shortness of breath.   Hematologic/Lymphatic: Negative for bleeding problem.    Studies Reviewed:   EKG: 06/02/2024:sinus rhythm, 87 bpm, isolated T wave inversion in lead III.   Echocardiogram: 06/03/2024 LVEF 60-65%. Diastolic function normal. Right  ventricular size and function normal. No significant valvular heart disease Estimated RAP 3 mmHg  Coronary CTA 06/03/2024 1. Coronary calcium score of 0. This was 1st percentile for age-,sex, and race-matched controls.   2. Normal coronary origin with right dominance.   3. Normal coronary arteries with no evidence of coronary artery disease. CAD-RADS 0.  4.Radiology over read: No acute extracardiac findings  RADIOLOGY: NA  Risk Assessment/Calculations:   NA   Labs:       Latest Ref Rng & Units 06/03/2024    9:18 AM 06/02/2024    8:33 AM 06/02/2024    3:30 AM  CBC  WBC 4.0 - 10.5 K/uL 5.7  5.1  6.3   Hemoglobin 13.0 - 17.0 g/dL 85.2  86.4  86.0   Hematocrit 39.0 - 52.0 % 46.0  41.9  42.4   Platelets 150 - 400 K/uL 271  287  294        Latest Ref Rng & Units 06/03/2024    9:18 AM 06/02/2024    8:33 AM 06/02/2024    3:30 AM  BMP  Glucose 70 - 99 mg/dL 96   888   BUN 6 - 20 mg/dL 12   9   Creatinine 9.38 - 1.24 mg/dL 8.77  8.81  8.80   Sodium 135 - 145 mmol/L 137   138   Potassium 3.5 - 5.1 mmol/L 4.4   3.9   Chloride 98 - 111 mmol/L 103   105   CO2 22 - 32 mmol/L 20   22   Calcium 8.9 - 10.3 mg/dL 8.9   8.9       Latest Ref Rng & Units 06/03/2024  9:18 AM 06/02/2024    8:33 AM 06/02/2024    3:30 AM  CMP  Glucose 70 - 99 mg/dL 96   888   BUN 6 - 20 mg/dL 12   9   Creatinine 9.38 - 1.24 mg/dL 8.77  8.81  8.80   Sodium 135 - 145 mmol/L 137   138   Potassium 3.5 - 5.1 mmol/L 4.4   3.9   Chloride 98 - 111 mmol/L 103   105   CO2 22 - 32 mmol/L 20   22   Calcium 8.9 - 10.3 mg/dL 8.9   8.9     Lab Results  Component Value Date   CHOL 189 06/03/2024   HDL 26 (L) 06/03/2024   LDLCALC 112 (H) 06/03/2024   TRIG 255 (H) 06/03/2024   CHOLHDL 7.3 06/03/2024   Recent Labs    06/03/24 0918  LIPOA 53.5*   No components found for: NTPROBNP No results for input(s): PROBNP in the last 8760 hours. No results for input(s): TSH in the last 8760 hours.  Physical  Exam:    Today's Vitals   06/18/24 0912  BP: 108/72  Pulse: 71  Resp: 18  Weight: 270 lb 1.6 oz (122.5 kg)  Height: 6' 2 (1.88 m)   Body mass index is 34.68 kg/m. Wt Readings from Last 3 Encounters:  06/18/24 270 lb 1.6 oz (122.5 kg)  06/02/24 273 lb (123.8 kg)  09/26/18 223 lb (101.2 kg)    Physical Exam   Impression & Recommendation(s):  Impression:   ICD-10-CM   1. Precordial chest pain  R07.2     2. 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 Encounter to discuss test results  Z71.2        Recommendation(s):  Precordial chest pain Recently hospitalized for evaluation of precordial pain and family history. Echocardiogram and coronary CTA results and reviewed independently.   Images of the coronary CTA reviewed with the patient during today's encounter. Reemphasize importance of improving his lipid profile and obtaining an ideal body weight. Patient is motivated with regards to weight loss.  Pure hypertriglyceridemia Triglycerides and LDL are not well-controlled based on the most recent lipid profile Given his young age and coronary calcium score of 0 we will hold off on pharmacological therapy. However, he is educated on the importance of improving his lab indices. Recommended that he establishes care with PCP so that he has annual well visit which would include checking lipids.  Orders Placed:  No orders of the defined types were placed in this encounter.    Final Medication List:   No orders of the defined types were placed in this encounter.   There are no discontinued medications.   Current Outpatient Medications:    Omega-3 Fatty Acids (FISH OIL PO), Take 1 capsule by mouth every 14 (fourteen) days., Disp: , Rfl:    albuterol (VENTOLIN HFA) 108 (90 Base) MCG/ACT inhaler, Inhale 2 puffs into the lungs every 4 (four) hours as needed., Disp: , Rfl:    DHA-EPA-Vitamin E (OMEGA-3 COMPLEX PO), Take 1 tablet by mouth See admin instructions. Patient states that  he takes this medication infrequently (Patient not taking: Reported on 06/18/2024), Disp: , Rfl:    omeprazole  (PRILOSEC) 40 MG capsule, Take 1 capsule (40 mg total) by mouth daily. (Patient not taking: Reported on 06/02/2024), Disp: 30 capsule, Rfl: 3  Consent:   NA  Disposition:   PRN  His questions and concerns were addressed to his satisfaction. He  voices understanding of the recommendations provided during this encounter.    Signed, Madonna Michele HAS, [facility]  A Division of [facility] [address] [place], [place] [address]  06/18/2024 9:56 AM

## 2024-06-18 NOTE — Patient Instructions (Signed)
 Medication Instructions:  Your physician recommends that you continue on your current medications as directed. Please refer to the Current Medication list given to you today.  *If you need a refill on your cardiac medications before your next appointment, please call your pharmacy*  Lab Work: None ordered.  You may go to any [facility] Location for your lab work:  [facility] - [address] ([facility]) - [address] [phone] [address]  [place] - [address]   [place]  - [address]   [place] - [address] - [address] [employer]  - [address] [place] [address] ([facility]   If you have labs (blood work) drawn today and your tests are completely normal, you will receive your results only by: [employer] (if you have MyChart)  If you have any lab test that is abnormal or we need to change your treatment, we will call you or send a MyChart message to review the results.  Testing/Procedures: None ordered.  Follow-Up: At [facility], you and your health needs are our priority.  As part of our continuing mission to provide you with exceptional heart care, we have created designated Provider Care Teams.  These Care Teams include your primary Cardiologist (physician) and Advanced Practice Providers (APPs -  Physician Assistants and Nurse Practitioners) who all work together to provide you with the care you need, when you need it.  Your next appointment:   As needed

## 2024-07-05 ENCOUNTER — Ambulatory Visit: Payer: Self-pay

## 2024-07-05 NOTE — Telephone Encounter (Signed)
    Message from [place] sent at 07/05/2024  8:17 AM EDT  Summary: Non productive cough resulting after the flu patient contact [phone]   Reason for Triage: Patient called from [phone], he states he had the flu a week ago, and the flu itself has subsided but he is left with a cough that is non-productive most of the time which is hurting him when coughing because nothing is coming up during those coughing sessions. Note: Patient attempted to get an appt with [facility] however, it flagged me on the decision tree, and advised me to send CRM for PCP establishment to the Admin. The patient stated he does not want male, he does not want NP, he wants a male with extended experience, but wanted a visit today to get meds for cough. He stated he had spoke to a male nurse previously at an urgent care and she was male and he felt she didn't know what she was speaking about. He stated outside of [facility] he had located a provider Nschal Narenda at [facility] in which I advised the potential patient that Dr. Jeffie was not taking new patient's at this time.

## 2024-07-05 NOTE — Telephone Encounter (Signed)
 Answer Assessment - Initial Assessment Questions 1. REASON FOR CALL or QUESTION: What is your reason for calling today? or How can I best     Establish Care -male doctor only. Scheduled   Patient is recovering from flu. He had an urgent care visit today. Declines triage at this time.  Protocols used: PCP Call - No Triage-A-AH

## 2024-07-19 ENCOUNTER — Ambulatory Visit: Admitting: Student in an Organized Health Care Education/Training Program

## 2024-07-19 DIAGNOSIS — R140 Abdominal distension (gaseous): Secondary | ICD-10-CM | POA: Insufficient documentation

## 2024-07-19 DIAGNOSIS — R1013 Epigastric pain: Secondary | ICD-10-CM | POA: Insufficient documentation

## 2024-07-19 DIAGNOSIS — J029 Acute pharyngitis, unspecified: Secondary | ICD-10-CM | POA: Insufficient documentation
